# Patient Record
Sex: Male | Born: 1955 | Race: Black or African American | Hispanic: No | Marital: Single | Smoking: Current every day smoker
Health system: Southern US, Community
[De-identification: ages and names within clinical notes are randomized; demographics above are authoritative.]

## PROBLEM LIST (undated history)

## (undated) DIAGNOSIS — F22 Delusional disorders: Secondary | ICD-10-CM

## (undated) DIAGNOSIS — M1990 Unspecified osteoarthritis, unspecified site: Secondary | ICD-10-CM

## (undated) DIAGNOSIS — E119 Type 2 diabetes mellitus without complications: Secondary | ICD-10-CM

## (undated) DIAGNOSIS — J40 Bronchitis, not specified as acute or chronic: Secondary | ICD-10-CM

## (undated) DIAGNOSIS — H409 Unspecified glaucoma: Secondary | ICD-10-CM

## (undated) DIAGNOSIS — I469 Cardiac arrest, cause unspecified: Secondary | ICD-10-CM

## (undated) DIAGNOSIS — F259 Schizoaffective disorder, unspecified: Secondary | ICD-10-CM

## (undated) DIAGNOSIS — M069 Rheumatoid arthritis, unspecified: Secondary | ICD-10-CM

## (undated) DIAGNOSIS — F250 Schizoaffective disorder, bipolar type: Secondary | ICD-10-CM

## (undated) HISTORY — PX: OTHER SURGICAL HISTORY: SHX169

## (undated) HISTORY — PX: MANDIBLE FRACTURE SURGERY: SHX706

---

## 2014-05-28 ENCOUNTER — Emergency Department (HOSPITAL_COMMUNITY)
Admission: EM | Admit: 2014-05-28 | Discharge: 2014-05-28 | Disposition: A | Discharge status: Home / Self Care | Payer: Medicaid Other | Attending: Emergency Medicine | Admitting: Emergency Medicine

## 2014-05-28 ENCOUNTER — Encounter (HOSPITAL_COMMUNITY): Payer: Self-pay | Admitting: Emergency Medicine

## 2014-05-28 ENCOUNTER — Emergency Department (HOSPITAL_COMMUNITY): Payer: Medicaid Other

## 2014-05-28 DIAGNOSIS — H409 Unspecified glaucoma: Secondary | ICD-10-CM | POA: Insufficient documentation

## 2014-05-28 DIAGNOSIS — F17200 Nicotine dependence, unspecified, uncomplicated: Secondary | ICD-10-CM | POA: Insufficient documentation

## 2014-05-28 DIAGNOSIS — R079 Chest pain, unspecified: Secondary | ICD-10-CM | POA: Insufficient documentation

## 2014-05-28 DIAGNOSIS — Z7982 Long term (current) use of aspirin: Secondary | ICD-10-CM | POA: Insufficient documentation

## 2014-05-28 DIAGNOSIS — F22 Delusional disorders: Secondary | ICD-10-CM | POA: Insufficient documentation

## 2014-05-28 DIAGNOSIS — Z8709 Personal history of other diseases of the respiratory system: Secondary | ICD-10-CM | POA: Insufficient documentation

## 2014-05-28 HISTORY — DX: Unspecified glaucoma: H40.9

## 2014-05-28 HISTORY — DX: Bronchitis, not specified as acute or chronic: J40

## 2014-05-28 HISTORY — DX: Schizoaffective disorder, bipolar type: F25.0

## 2014-05-28 HISTORY — DX: Delusional disorders: F22

## 2014-05-28 HISTORY — DX: Schizoaffective disorder, unspecified: F25.9

## 2014-05-28 LAB — COMPREHENSIVE METABOLIC PANEL
ALT: 7 U/L (ref 0–53)
AST: 9 U/L (ref 0–37)
Albumin: 3.7 g/dL (ref 3.5–5.2)
Alkaline Phosphatase: 80 U/L (ref 39–117)
BUN: 9 mg/dL (ref 6–23)
CO2: 27 meq/L (ref 19–32)
Calcium: 9.6 mg/dL (ref 8.4–10.5)
Chloride: 102 mEq/L (ref 96–112)
Creatinine, Ser: 0.84 mg/dL (ref 0.50–1.35)
GFR calc Af Amer: >90 mL/min (ref >90)
GLUCOSE: 78 mg/dL (ref 70–99)
Potassium: 4.5 mEq/L (ref 3.7–5.3)
SODIUM: 141 meq/L (ref 137–147)
TOTAL PROTEIN: 7.2 g/dL (ref 6.0–8.3)
Total Bilirubin: 0.2 mg/dL — ABNORMAL LOW (ref 0.3–1.2)

## 2014-05-28 LAB — CBC
HCT: 40.2 % (ref 39.0–52.0)
HEMOGLOBIN: 13.9 g/dL (ref 13.0–17.0)
MCH: 33.6 pg (ref 26.0–34.0)
MCHC: 34.6 g/dL (ref 30.0–36.0)
MCV: 97.1 fL (ref 78.0–100.0)
Platelets: 254 10*3/uL (ref 150–400)
RBC: 4.14 MIL/uL — AB (ref 4.22–5.81)
RDW: 14.8 % (ref 11.5–15.5)
WBC: 6.5 10*3/uL (ref 4.0–10.5)

## 2014-05-28 LAB — TROPONIN I: Troponin I: <0.3 ng/mL (ref <0.30)

## 2014-05-28 LAB — MAGNESIUM: Magnesium: 2.2 mg/dL (ref 1.5–2.5)

## 2014-05-28 MED ORDER — ASPIRIN 81 MG PO CHEW: 324.0000 mg | CHEWABLE_TABLET | Freq: Once | ORAL | Status: DC

## 2014-05-28 MED ORDER — ASPIRIN 81 MG PO CHEW
243.0000 mg | CHEWABLE_TABLET | Freq: Once | ORAL | Status: AC
  Administered 2014-05-28: 243 mg via ORAL
  Filled 2014-05-28: qty 3

## 2014-05-28 NOTE — ED Notes (Signed)
Staff at [facility] states pt wanted to go to the post office. States he was told some one would take him but he would have to wait a while. States pt got mad because he could no go right then. States he got mad and told them he chest was hurting.

## 2014-05-28 NOTE — ED Notes (Signed)
Pt states he was arguing with staff at [facility] on Friday when he began having chest pain. States he got into another argument today with staff and his chest started hurting again.

## 2014-05-28 NOTE — ED Provider Notes (Signed)
CSN: [id number]     Arrival date & time 05/28/14  1220 History   First MD Initiated Contact with Patient 05/28/14 1303     Chief Complaint  Patient presents with  . Chest Pain     (Consider location/radiation/quality/duration/timing/severity/associated sxs/prior Treatment) HPI Patient presents with chest pain, now resolved. Patient notes that over the past days he is in some episodes of chest pain, including earlier today. Pain is typically left sided, pressure-like, lasts for several minutes. This episode began without clear precipitant, but became worse with a heated exchange with a caregiver. There was no concurrent dyspnea, lightheadedness, nausea, vomiting, abdominal pain. Previous episodes were seemingly similar. Patient has been evaluated once at another facility for chest pain in the last several months, had unremarkable evaluation, was discharged home. Patient states he is generally well aside from history of psychiatric disease, requiring residence in an assisted living facility. He currently denies any overt hallucinations, delusions. However the patient gives a rambling history, is clearly incapable of providing all aspects of relevant history of present illness. Level V caveat  Past Medical History  Diagnosis Date  . Bronchitis   . Schizo affective schizophrenia   . Paranoid   . Glaucoma    Past Surgical History  Procedure Laterality Date  . Mandible fracture surgery     No family history on file. History  Substance Use Topics  . Smoking status: Current Every Day Smoker    Types: Cigarettes  . Smokeless tobacco: Not on file  . Alcohol Use: No    Review of Systems  Unable to perform ROS: Psychiatric disorder      Allergies  Review of patient's allergies indicates no known allergies.  Home Medications   Prior to Admission medications   Medication Sig Start Date End Date Taking? Authorizing Provider  aspirin EC 81 MG tablet Take 81 mg by mouth daily.    Yes Historical Provider, MD  divalproex (DEPAKOTE) 250 MG DR tablet Take 500 mg by mouth at bedtime.   Yes Historical Provider, MD  haloperidol (HALDOL) 2 MG tablet Take 2 mg by mouth at bedtime.   Yes Historical Provider, MD  latanoprost (XALATAN) 0.005 % ophthalmic solution Place 1 drop into both eyes at bedtime.   Yes Historical Provider, MD  Travoprost, BAK Free, (TRAVATAN) 0.004 % SOLN ophthalmic solution Place 1 drop into both eyes at bedtime.   Yes Historical Provider, MD   BP 117/84  Pulse 68  Temp(Src) 98.2 F (36.8 C) (Oral)  Resp 18  SpO2 100% Physical Exam  Nursing note and vitals reviewed. Constitutional: He is oriented to person, place, and time. He appears well-developed. No distress.  HENT:  Head: Normocephalic and atraumatic.  Middle-aged male, no distress, poorly applied haircoloring to eye brows, hair  Eyes: Conjunctivae and EOM are normal.  Cardiovascular: Normal rate and regular rhythm.   Pulmonary/Chest: Effort normal. No stridor. No respiratory distress.  Abdominal: He exhibits no distension.  Musculoskeletal: He exhibits no edema.  Neurological: He is alert and oriented to person, place, and time.  Skin: Skin is warm and dry.  Psychiatric: He has a normal mood and affect. His speech is tangential. Thought content is delusional. Cognition and memory are impaired.    ED Course  Procedures (including critical care time) Labs Review Labs Reviewed  CBC - Abnormal; Notable for the following:    RBC 4.14 (*)    All other components within normal limits  COMPREHENSIVE METABOLIC PANEL - Abnormal; Notable for the following:  Total Bilirubin 0.2 (*)    All other components within normal limits  MAGNESIUM  TROPONIN I    Imaging Review Dg Chest 2 View  05/28/2014   CLINICAL DATA:  Left chest pressure today. Recent cough and congestion.  EXAM: CHEST  2 VIEW  COMPARISON:  None.  FINDINGS: The heart size and mediastinal contours are within normal limits. Both  lungs are clear. The visualized skeletal structures are unremarkable.  IMPRESSION: No active cardiopulmonary disease.   Electronically Signed   By: Amie Portland M.D.   On: 05/28/2014 13:57     EKG Interpretation   Date/Time:  Monday May 28 2014 12:34:01 EDT Ventricular Rate:  70 PR Interval:  127 QRS Duration: 79 QT Interval:  364 QTC Calculation: 393 R Axis:   25 Text Interpretation:  Sinus rhythm Borderline T abnormalities, inferior  leads Sinus rhythm T wave abnormality Artifact Abnormal ekg Confirmed by  Gerhard Munch  MD [phone]) on 05/28/2014 1:06:21 PM      I reviewed the patient's [employer] medical record, and after the initial evaluation, contacted social work to assist with his consideration of other living options.  3:28 PM Patient sitting on the edge of the bed, requesting discharge.  She has no new complaints, no chest pain.  MDM  Patient presents after an episode of chest pain, now resolved. Patient's evaluation here is largely reassuring, with no evidence of ongoing ischemia, systemic infection, pulmonary embolism. After the initial evaluation I discussed the patient's case with social work assisted with providing additional resources to the patient.  Gerhard Munch, MD 05/28/14 [phone]

## 2014-05-28 NOTE — Discharge Instructions (Signed)
As discussed, your evaluation today has been largely reassuring.  But, it is important that you monitor your condition carefully, and do not hesitate to return to the ED if you develop new, or concerning changes in your condition. ° °Otherwise, please follow-up with your physician for appropriate ongoing care. ° ° °Chest Pain (Nonspecific) °It is often hard to give a specific diagnosis for the cause of chest pain. There is always a chance that your pain could be related to something serious, such as a heart attack or a blood clot in the lungs. You need to follow up with your caregiver for further evaluation. °CAUSES  °· Heartburn. °· Pneumonia or bronchitis. °· Anxiety or stress. °· Inflammation around your heart (pericarditis) or lung (pleuritis or pleurisy). °· A blood clot in the lung. °· A collapsed lung (pneumothorax). It can develop suddenly on its own (spontaneous pneumothorax) or from injury (trauma) to the chest. °· Shingles infection (herpes zoster virus). °The chest wall is composed of bones, muscles, and cartilage. Any of these can be the source of the pain. °· The bones can be bruised by injury. °· The muscles or cartilage can be strained by coughing or overwork. °· The cartilage can be affected by inflammation and become sore (costochondritis). °DIAGNOSIS  °Lab tests or other studies, such as X-rays, electrocardiography, stress testing, or cardiac imaging, may be needed to find the cause of your pain.  °TREATMENT  °· Treatment depends on what may be causing your chest pain. Treatment may include: °· Acid blockers for heartburn. °· Anti-inflammatory medicine. °· Pain medicine for inflammatory conditions. °· Antibiotics if an infection is present. °· You may be advised to change lifestyle habits. This includes stopping smoking and avoiding alcohol, caffeine, and chocolate. °· You may be advised to keep your head raised (elevated) when sleeping. This reduces the chance of acid going backward from your  stomach into your esophagus. °· Most of the time, nonspecific chest pain will improve within 2 to 3 days with rest and mild pain medicine. °HOME CARE INSTRUCTIONS  °· If antibiotics were prescribed, take your antibiotics as directed. Finish them even if you start to feel better. °· For the next few days, avoid physical activities that bring on chest pain. Continue physical activities as directed. °· Do not smoke. °· Avoid drinking alcohol. °· Only take over-the-counter or prescription medicine for pain, discomfort, or fever as directed by your caregiver. °· Follow your caregiver's suggestions for further testing if your chest pain does not go away. °· Keep any follow-up appointments you made. If you do not go to an appointment, you could develop lasting (chronic) problems with pain. If there is any problem keeping an appointment, you must call to reschedule. °SEEK MEDICAL CARE IF:  °· You think you are having problems from the medicine you are taking. Read your medicine instructions carefully. °· Your chest pain does not go away, even after treatment. °· You develop a rash with blisters on your chest. °SEEK IMMEDIATE MEDICAL CARE IF:  °· You have increased chest pain or pain that spreads to your arm, neck, jaw, back, or abdomen. °· You develop shortness of breath, an increasing cough, or you are coughing up blood. °· You have severe back or abdominal pain, feel nauseous, or vomit. °· You develop severe weakness, fainting, or chills. °· You have a fever. °THIS IS AN EMERGENCY. Do not wait to see if the pain will go away. Get medical help at once. Call your local emergency services (  911 in [place] Do not drive yourself to the hospital. °MAKE SURE YOU:  °· Understand these instructions. °· Will watch your condition. °· Will get help right away if you are not doing well or get worse. °Document Released: 09/16/2005 Document Revised: 02/29/2012 Document Reviewed: 07/12/2008 °ExitCare® Patient Information ©2014 [employer]. ° ° °

## 2014-05-29 NOTE — Care Management Note (Signed)
Pt from ALF in [place], and was asking about moving to another facility. He had multiple complaints about the facility, such as "they would not take me to the the post office for 5 days in a row, to pick up a package that my sister sent me". Per RN EMS says all complaints were the same as thoses given to them about the last ALF pt was in. They just transferred him about 1 month ago to this new ALF. RN also reports facility employee said pt asked to be taken to post office today for the first time, and became angry when told he would have to wait for the Zenaida Niece and driver to return, as they were out on a trip with someone else, but would return soon. Was asked to give pt the list of facilities in [facility], per CSW, Derenda Fennel, since he wanted to get out of [place], and this was done. He expressed appreciation. Suggested he call his case worker at [facility] if he is having problems with his ALF. He says "she never returns my calls". Also suggested he call a family member he assist, and advocate for him. He says they all live in another county. He is thinking of maybe moving to that county to be near them. Suggested that this would be a very good idea, to be close to family, in order to have family to assist him as needed.

## 2014-06-20 ENCOUNTER — Emergency Department (HOSPITAL_COMMUNITY)
Admission: EM | Admit: 2014-06-20 | Discharge: 2014-06-21 | Disposition: A | Discharge status: Home / Self Care | Payer: Medicaid Other | Attending: Emergency Medicine | Admitting: Emergency Medicine

## 2014-06-20 ENCOUNTER — Emergency Department (HOSPITAL_COMMUNITY): Payer: Medicaid Other

## 2014-06-20 ENCOUNTER — Encounter (HOSPITAL_COMMUNITY): Payer: Self-pay | Admitting: Emergency Medicine

## 2014-06-20 DIAGNOSIS — F259 Schizoaffective disorder, unspecified: Secondary | ICD-10-CM | POA: Insufficient documentation

## 2014-06-20 DIAGNOSIS — Z79899 Other long term (current) drug therapy: Secondary | ICD-10-CM | POA: Insufficient documentation

## 2014-06-20 DIAGNOSIS — R079 Chest pain, unspecified: Secondary | ICD-10-CM | POA: Insufficient documentation

## 2014-06-20 DIAGNOSIS — H409 Unspecified glaucoma: Secondary | ICD-10-CM | POA: Insufficient documentation

## 2014-06-20 DIAGNOSIS — Z8709 Personal history of other diseases of the respiratory system: Secondary | ICD-10-CM | POA: Diagnosis not present

## 2014-06-20 DIAGNOSIS — Z733 Stress, not elsewhere classified: Secondary | ICD-10-CM | POA: Insufficient documentation

## 2014-06-20 DIAGNOSIS — Z7982 Long term (current) use of aspirin: Secondary | ICD-10-CM | POA: Diagnosis not present

## 2014-06-20 DIAGNOSIS — F439 Reaction to severe stress, unspecified: Secondary | ICD-10-CM

## 2014-06-20 DIAGNOSIS — F17200 Nicotine dependence, unspecified, uncomplicated: Secondary | ICD-10-CM | POA: Insufficient documentation

## 2014-06-20 LAB — CBC WITH DIFFERENTIAL/PLATELET
BASOS ABS: 0 10*3/uL (ref 0.0–0.1)
Basophils Relative: 0 % (ref 0–1)
Eosinophils Absolute: 0.2 10*3/uL (ref 0.0–0.7)
Eosinophils Relative: 3 % (ref 0–5)
HCT: 40.8 % (ref 39.0–52.0)
Hemoglobin: 14 g/dL (ref 13.0–17.0)
Lymphocytes Relative: 44 % (ref 12–46)
Lymphs Abs: 3.5 10*3/uL (ref 0.7–4.0)
MCH: 33.3 pg (ref 26.0–34.0)
MCHC: 34.3 g/dL (ref 30.0–36.0)
MCV: 97.1 fL (ref 78.0–100.0)
Monocytes Absolute: 0.7 10*3/uL (ref 0.1–1.0)
Monocytes Relative: 9 % (ref 3–12)
NEUTROS PCT: 44 % (ref 43–77)
Neutro Abs: 3.5 10*3/uL (ref 1.7–7.7)
PLATELETS: 306 10*3/uL (ref 150–400)
RBC: 4.2 MIL/uL — AB (ref 4.22–5.81)
RDW: 15.7 % — AB (ref 11.5–15.5)
WBC: 7.8 10*3/uL (ref 4.0–10.5)

## 2014-06-20 LAB — URINALYSIS, ROUTINE W REFLEX MICROSCOPIC
Bilirubin Urine: NEGATIVE
Glucose, UA: NEGATIVE mg/dL
Hgb urine dipstick: NEGATIVE
KETONES UR: NEGATIVE mg/dL
NITRITE: NEGATIVE
Protein, ur: NEGATIVE mg/dL
Urobilinogen, UA: 0.2 mg/dL (ref 0.0–1.0)
pH: 5.5 (ref 5.0–8.0)

## 2014-06-20 LAB — URINE MICROSCOPIC-ADD ON

## 2014-06-20 MED ORDER — ASPIRIN 81 MG PO CHEW
324.0000 mg | CHEWABLE_TABLET | Freq: Once | ORAL | Status: DC
  Filled 2014-06-20: qty 4

## 2014-06-20 NOTE — ED Notes (Signed)
Pt c/o chest pain after arguing with facility workers. Pt states [facility] lives [facility].

## 2014-06-20 NOTE — ED Notes (Signed)
Pt states he needs to go to a psych ward for help. Pt states he is being tormented. Pt states he is in a crisis. Pt will not admit and si/hi ideations at this time.

## 2014-06-21 LAB — BASIC METABOLIC PANEL
ANION GAP: 11 (ref 5–15)
BUN: 8 mg/dL (ref 6–23)
CALCIUM: 9.5 mg/dL (ref 8.4–10.5)
CHLORIDE: 104 meq/L (ref 96–112)
CO2: 26 meq/L (ref 19–32)
Creatinine, Ser: 0.84 mg/dL (ref 0.50–1.35)
GFR calc Af Amer: >90 mL/min (ref >90)
GFR calc non Af Amer: >90 mL/min (ref >90)
GLUCOSE: 78 mg/dL (ref 70–99)
Potassium: 3.9 mEq/L (ref 3.7–5.3)
SODIUM: 141 meq/L (ref 137–147)

## 2014-06-21 LAB — TROPONIN I

## 2014-06-21 NOTE — ED Provider Notes (Signed)
Medical screening examination/treatment/procedure(s) were conducted as a shared visit with non-physician practitioner(s) and myself.  I personally evaluated the patient during the encounter.   EKG Interpretation   Date/Time:  Wednesday June 20 2014 23:44:50 EDT Ventricular Rate:  64 PR Interval:  134 QRS Duration: 77 QT Interval:  371 QTC Calculation: 383 R Axis:   -12 Text Interpretation:  Sinus rhythm Inferior infarct, old No significant  change was found Confirmed by Layken Beg  MD, Kaithlyn Teagle ([id number]) on 06/21/2014  1:09:21 AM      Overall well appearing. Low suspicion for ACS. Suspect GERD given hx. Recommended OTC prilosec. Does not have a pcp, recommended obtaining an appointment with one. Understands to return to ER for new or worsening symptoms  Lyanne CoKevin M Matsuko Kretz, MD 06/21/14 [phone]

## 2014-06-21 NOTE — ED Provider Notes (Signed)
CSN: [id number]     Arrival date & time 06/20/14  2230 History   First MD Initiated Contact with Patient 06/20/14 2248     Chief Complaint  Patient presents with  . Chest Pain     (Consider location/radiation/quality/duration/timing/severity/associated sxs/prior Treatment) The history is provided by the patient.    Frank Dean is a 58 y.o. male with a history of bronchitis and schizophrenia with paranoia, presenting with chest pain about 5 hours before arrival while arguing with a caregiver at his residence (lives in an assisted living setting). His pain was constant but improved since receiving ASA 324 mg per ems prior to arrival. Pertinent negatives include nausea, diaphoresis, shortness of breath, dizziness, headache and weakness.   He denies history of cardiac disease.  He also inquires about being admitted for a psych admission, but denies suicidal, homicidal ideation, nor is he having auditory or visual hallucinations.  He simply is not desirous of returning to his home tonight.  He has been in his assisted living for the past month but does not feel comfortable there and wants to find new housing. He does not feel unsafe in his home environment.       Past Medical History  Diagnosis Date  . Bronchitis   . Schizo affective schizophrenia   . Paranoid   . Glaucoma    Past Surgical History  Procedure Laterality Date  . Mandible fracture surgery     No family history on file. History  Substance Use Topics  . Smoking status: Current Every Day Smoker    Types: Cigarettes  . Smokeless tobacco: Not on file  . Alcohol Use: No    Review of Systems  Constitutional: Negative for fever.  HENT: Negative for congestion and sore throat.   Eyes: Negative.   Respiratory: Positive for chest tightness. Negative for shortness of breath and wheezing.   Cardiovascular: Negative for chest pain and palpitations.  Gastrointestinal: Negative for nausea and abdominal pain.  Genitourinary:  Negative.   Musculoskeletal: Negative for arthralgias, joint swelling and neck pain.  Skin: Negative.  Negative for rash and wound.  Neurological: Negative for dizziness, weakness, light-headedness, numbness and headaches.  Psychiatric/Behavioral: Negative.       Allergies  Review of patient's allergies indicates no known allergies.  Home Medications   Prior to Admission medications   Medication Sig Start Date End Date Taking? Authorizing Provider  aspirin EC 81 MG tablet Take 81 mg by mouth daily.   Yes Historical Provider, MD  divalproex (DEPAKOTE) 250 MG DR tablet Take 500 mg by mouth at bedtime.   Yes Historical Provider, MD  haloperidol (HALDOL) 2 MG tablet Take 2 mg by mouth at bedtime.   Yes Historical Provider, MD  latanoprost (XALATAN) 0.005 % ophthalmic solution Place 1 drop into both eyes at bedtime.   Yes Historical Provider, MD  Travoprost, BAK Free, (TRAVATAN) 0.004 % SOLN ophthalmic solution Place 1 drop into both eyes at bedtime.   Yes Historical Provider, MD   BP 117/77  Pulse 69  Temp(Src) 98 F (36.7 C) (Oral)  Resp 18  Ht 5\' 6"  (1.676 m)  Wt 160 lb (72.576 kg)  BMI 25.84 kg/m2  SpO2 97% Physical Exam  Nursing note and vitals reviewed. Constitutional: He appears well-developed and well-nourished.  HENT:  Head: Normocephalic and atraumatic.  Eyes: Conjunctivae are normal.  Neck: Normal range of motion.  Cardiovascular: Normal rate, regular rhythm, normal heart sounds and intact distal pulses.   Pulmonary/Chest: Effort normal and breath  sounds normal. He has no wheezes. He exhibits no tenderness.  Abdominal: Soft. Bowel sounds are normal. There is no tenderness.  Musculoskeletal: Normal range of motion.  Neurological: He is alert.  Skin: Skin is warm and dry.  Psychiatric: He has a normal mood and affect.    ED Course  Procedures (including critical care time) Labs Review Labs Reviewed  URINALYSIS, ROUTINE W REFLEX MICROSCOPIC - Abnormal; Notable  for the following:    Color, Urine STRAW (*)    Specific Gravity, Urine <1.005 (*)    Leukocytes, UA MODERATE (*)    All other components within normal limits  CBC WITH DIFFERENTIAL - Abnormal; Notable for the following:    RBC 4.20 (*)    RDW 15.7 (*)    All other components within normal limits  TROPONIN I  BASIC METABOLIC PANEL  URINE MICROSCOPIC-ADD ON    Imaging Review Dg Chest 2 View  06/21/2014   CLINICAL DATA:  CHEST PAIN  EXAM: CHEST  2 VIEW  COMPARISON:  Prior radiograph from 05/28/2014  FINDINGS: The cardiac and mediastinal silhouettes are stable in size and contour, and remain within normal limits.  The lungs are normally inflated. No airspace consolidation, pleural effusion, or pulmonary edema is identified. There is no pneumothorax.  No acute osseous abnormality identified.  IMPRESSION: No active cardiopulmonary disease.   Electronically Signed   By: Rise MuBenjamin  McClintock M.D.   On: 06/21/2014 01:13     EKG Interpretation   Date/Time:  Wednesday June 20 2014 23:44:50 EDT Ventricular Rate:  64 PR Interval:  134 QRS Duration: 77 QT Interval:  371 QTC Calculation: 383 R Axis:   -12 Text Interpretation:  Sinus rhythm Inferior infarct, old No significant  change was found Confirmed by CAMPOS  MD, KEVIN ([id number]) on 06/21/2014  1:09:21 AM      MDM   Final diagnoses:  Chest pain due to psychological stress    Pt with chest pain, now resolved.  This is his second visit here since last month for similar psych/stress induced chest pain.  His ekg and 6 hour troponin are stable/negative.  He was encouraged f/u with [facility] here in [place] for cardiac stress testing.  He should call for appt to arrange.  Instructions given for this.   The patient appears reasonably screened and/or stabilized for discharge and I doubt any other medical condition or other [facility] requiring further screening, evaluation, or treatment in the ED at this time prior to discharge.   Pt was  seen by Dr. Patria Maneampos prior to dc home.   Patients labs and/or radiological studies were viewed and considered during the medical decision making and disposition process.     Burgess AmorJulie Tenoch Mcclure, PA-C 06/21/14 [phone]

## 2014-06-21 NOTE — Discharge Instructions (Signed)

## 2014-07-02 ENCOUNTER — Emergency Department (HOSPITAL_COMMUNITY): Payer: Medicaid Other

## 2014-07-02 ENCOUNTER — Encounter (HOSPITAL_COMMUNITY): Payer: Self-pay | Admitting: Emergency Medicine

## 2014-07-02 ENCOUNTER — Emergency Department (HOSPITAL_COMMUNITY)
Admission: EM | Admit: 2014-07-02 | Discharge: 2014-07-02 | Disposition: A | Discharge status: Home / Self Care | Payer: Medicaid Other | Attending: Emergency Medicine | Admitting: Emergency Medicine

## 2014-07-02 DIAGNOSIS — F17200 Nicotine dependence, unspecified, uncomplicated: Secondary | ICD-10-CM | POA: Insufficient documentation

## 2014-07-02 DIAGNOSIS — Z7982 Long term (current) use of aspirin: Secondary | ICD-10-CM | POA: Insufficient documentation

## 2014-07-02 DIAGNOSIS — Z8669 Personal history of other diseases of the nervous system and sense organs: Secondary | ICD-10-CM | POA: Diagnosis not present

## 2014-07-02 DIAGNOSIS — Z8709 Personal history of other diseases of the respiratory system: Secondary | ICD-10-CM | POA: Diagnosis not present

## 2014-07-02 DIAGNOSIS — Z79899 Other long term (current) drug therapy: Secondary | ICD-10-CM | POA: Insufficient documentation

## 2014-07-02 DIAGNOSIS — N453 Epididymo-orchitis: Secondary | ICD-10-CM | POA: Diagnosis not present

## 2014-07-02 DIAGNOSIS — H409 Unspecified glaucoma: Secondary | ICD-10-CM | POA: Insufficient documentation

## 2014-07-02 DIAGNOSIS — R109 Unspecified abdominal pain: Secondary | ICD-10-CM | POA: Diagnosis present

## 2014-07-02 DIAGNOSIS — N451 Epididymitis: Secondary | ICD-10-CM

## 2014-07-02 LAB — URINALYSIS, ROUTINE W REFLEX MICROSCOPIC
BILIRUBIN URINE: NEGATIVE
Glucose, UA: NEGATIVE mg/dL
KETONES UR: NEGATIVE mg/dL
Nitrite: POSITIVE — AB
PROTEIN: NEGATIVE mg/dL
Specific Gravity, Urine: 1.015 (ref 1.005–1.030)
Urobilinogen, UA: 0.2 mg/dL (ref 0.0–1.0)
pH: 6.5 (ref 5.0–8.0)

## 2014-07-02 LAB — URINE MICROSCOPIC-ADD ON

## 2014-07-02 MED ORDER — FENTANYL CITRATE 0.05 MG/ML IJ SOLN
100.0000 ug | Freq: Once | INTRAMUSCULAR | Status: AC
  Administered 2014-07-02: 100 ug via NASAL
  Filled 2014-07-02: qty 2

## 2014-07-02 MED ORDER — LEVOFLOXACIN 750 MG PO TABS
750.0000 mg | ORAL_TABLET | Freq: Every day | ORAL | Status: DC
Start: 2014-07-02 — End: 2018-06-14

## 2014-07-02 MED ORDER — OXYCODONE-ACETAMINOPHEN 5-325 MG PO TABS: 2.0000 | ORAL_TABLET | Freq: Four times a day (QID) | ORAL | Status: DC | PRN

## 2014-07-02 MED ORDER — LEVOFLOXACIN 750 MG PO TABS
750.0000 mg | ORAL_TABLET | Freq: Once | ORAL | Status: AC
  Administered 2014-07-02: 750 mg via ORAL
  Filled 2014-07-02: qty 1

## 2014-07-02 NOTE — ED Notes (Signed)
[facility] called to pick patient up.

## 2014-07-02 NOTE — Discharge Instructions (Signed)
Epididymitis °Epididymitis is a swelling (inflammation) of the epididymis. The epididymis is a cord-like structure along the back part of the testicle. Epididymitis is usually, but not always, caused by infection. This is usually a sudden problem beginning with chills, fever and pain behind the scrotum and in the testicle. There may be swelling and redness of the testicle. °DIAGNOSIS  °Physical examination will reveal a tender, swollen epididymis. Sometimes, cultures are obtained from the urine or from prostate secretions to help find out if there is an infection or if the cause is a different problem. Sometimes, blood work is performed to see if your white blood cell count is elevated and if a germ (bacterial) or viral infection is present. Using this knowledge, an appropriate medicine which kills germs (antibiotic) can be chosen by your caregiver. A viral infection causing epididymitis will most often go away (resolve) without treatment. °HOME CARE INSTRUCTIONS  °· Hot sitz baths for 20 minutes, 4 times per day, may help relieve pain. °· Only take over-the-counter or prescription medicines for pain, discomfort or fever as directed by your caregiver. °· Take all medicines, including antibiotics, as directed. Take the antibiotics for the full prescribed length of time even if you are feeling better. °· It is very important to keep all follow-up appointments. °SEEK IMMEDIATE MEDICAL CARE IF:  °· You have a fever. °· You have pain not relieved with medicines. °· You have any worsening of your problems. °· Your pain seems to come and go. °· You develop pain, redness, and swelling in the scrotum and surrounding areas. °MAKE SURE YOU:  °· Understand these instructions. °· Will watch your condition. °· Will get help right away if you are not doing well or get worse. °Document Released: 12/04/2000 Document Revised: 02/29/2012 Document Reviewed: 10/24/2009 °ExitCare® Patient Information ©2015 [employer]. This information  is not intended to replace advice given to you by your health care provider. Make sure you discuss any questions you have with your health care provider. ° °

## 2014-07-02 NOTE — ED Provider Notes (Signed)
CSN: [id number]     Arrival date & time 07/02/14  1620 History   First MD Initiated Contact with Patient 07/02/14 1923     Chief Complaint  Patient presents with  . Groin Pain     (Consider location/radiation/quality/duration/timing/severity/associated sxs/prior Treatment) HPI 58 year old male with 2 weeks of constant 24/7 mild left testicular pain worsening the last 2 days and worse again all day today worse with palpation and movement with no trauma no dysuria no abdominal pain no vomiting with pain radiating at times toward his back and left thigh with no rash on his skin no fever no weakness or numbness to his legs and no change in bowel or bladder function. Past Medical History  Diagnosis Date  . Bronchitis   . Schizo affective schizophrenia   . Paranoid   . Glaucoma    Past Surgical History  Procedure Laterality Date  . Mandible fracture surgery     No family history on file. History  Substance Use Topics  . Smoking status: Current Every Day Smoker    Types: Cigarettes  . Smokeless tobacco: Not on file  . Alcohol Use: No    Review of Systems 10 Systems reviewed and are negative for acute change except as noted in the HPI.   Allergies  Review of patient's allergies indicates no known allergies.  Home Medications   Prior to Admission medications   Medication Sig Start Date End Date Taking? Authorizing Provider  aspirin EC 81 MG tablet Take 81 mg by mouth daily.   Yes Historical Provider, MD  divalproex (DEPAKOTE) 250 MG DR tablet Take 500 mg by mouth at bedtime.   Yes Historical Provider, MD  haloperidol (HALDOL) 2 MG tablet Take 2 mg by mouth at bedtime.   Yes Historical Provider, MD  latanoprost (XALATAN) 0.005 % ophthalmic solution Place 1 drop into both eyes at bedtime.   Yes Historical Provider, MD  Travoprost, BAK Free, (TRAVATAN) 0.004 % SOLN ophthalmic solution Place 1 drop into both eyes at bedtime.   Yes Historical Provider, MD  levofloxacin (LEVAQUIN)  750 MG tablet Take 1 tablet (750 mg total) by mouth daily. X 10 days 07/02/14   Hurman HornJohn M Steen Bisig, MD  oxyCODONE-acetaminophen (PERCOCET) 5-325 MG per tablet Take 2 tablets by mouth every 6 (six) hours as needed for severe pain. Dispense to go. 07/02/14   Hurman HornJohn M Harvey Lingo, MD  oxyCODONE-acetaminophen (PERCOCET) 5-325 MG per tablet Take 2 tablets by mouth every 6 (six) hours as needed for severe pain. 07/02/14   Hurman HornJohn M Lety Cullens, MD   BP 122/73  Pulse 64  Temp(Src) 98.2 F (36.8 C) (Oral)  Resp 18  SpO2 100% Physical Exam  Nursing note and vitals reviewed. Constitutional:  Awake, alert, nontoxic appearance.  HENT:  Head: Atraumatic.  Eyes: Right eye exhibits no discharge. Left eye exhibits no discharge.  Neck: Neck supple.  Cardiovascular: Normal rate and regular rhythm.   No murmur heard. Pulmonary/Chest: Effort normal and breath sounds normal. No respiratory distress. He has no wheezes. He has no rales. He exhibits no tenderness.  Abdominal: Soft. Bowel sounds are normal. He exhibits no distension and no mass. There is no tenderness. There is no rebound and no guarding.  Genitourinary:  Right testicle and right inguinal canal nontender; left testicular region appears tender and enlarged with no definite palpable inguinal hernia although limited exam due to the patient's discomfort with scrotal sac having normal appearance to the skin  Musculoskeletal: He exhibits no tenderness.  Baseline ROM, no  obvious new focal weakness.  Neurological: He is alert.  Mental status and motor strength appears baseline for patient and situation.  Skin: No rash noted.  Psychiatric: He has a normal mood and affect.    ED Course  Procedures (including critical care time) Patient / Family / Caregiver informed of clinical course, understand medical decision-making process, and agree with plan. Labs Review Labs Reviewed  URINALYSIS, ROUTINE W REFLEX MICROSCOPIC - Abnormal; Notable for the following:    APPearance  HAZY (*)    Hgb urine dipstick TRACE (*)    Nitrite POSITIVE (*)    Leukocytes, UA LARGE (*)    All other components within normal limits  URINE MICROSCOPIC-ADD ON - Abnormal; Notable for the following:    Bacteria, UA MANY (*)    All other components within normal limits  URINE CULTURE    Imaging Review No results found.   EKG Interpretation None      MDM   Final diagnoses:  Epididymitis, left    I doubt any other EMC precluding discharge at this time including, but not necessarily limited to the following:testicular torsion.    Hurman Horn, MD 07/06/14 [phone]

## 2014-07-02 NOTE — ED Notes (Signed)
C./o left groin pain radiating down left leg x1 weeks. Hx of abscess to area per pt.

## 2014-07-05 LAB — URINE CULTURE: Colony Count: >=100000

## 2014-07-06 ENCOUNTER — Telehealth (HOSPITAL_BASED_OUTPATIENT_CLINIC_OR_DEPARTMENT_OTHER): Payer: Self-pay | Admitting: Emergency Medicine

## 2014-07-06 NOTE — Telephone Encounter (Signed)
Post ED Visit - Positive Culture Follow-up  Culture report reviewed by antimicrobial stewardship pharmacist: []  Wes Dulaney, Pharm.D., BCPS []  Celedonio MiyamotoJeremy Frens, Pharm.D., BCPS [x]  Georgina PillionElizabeth Martin, Pharm.D., BCPS []  WildomarMinh Pham, [address] Rainbow BoulevardPharm.D., BCPS, AAHIVP []  Estella HuskMichelle Turner, Pharm.D., BCPS, AAHIVP []    Positive urine culture Treated with Levofloxacin, organism sensitive to the same and no further patient follow-up is required at this time.  Norm ParcelGammons, Brigham Cobbins [facility] Center[facility]aney 07/06/2014, 10:06 AM

## 2014-07-09 MED FILL — Oxycodone w/ Acetaminophen Tab 5-325 MG: ORAL | Qty: 6 | Status: AC

## 2015-02-24 IMAGING — US US ART/VEN ABD/PELV/SCROTUM DOPPLER LTD
1 series · 13 of 25 positions shown · non-contrast
Comparison: None.

CLINICAL DATA: Left testicular pain.

EXAM:
SCROTAL ULTRASOUND
DOPPLER ULTRASOUND OF THE TESTICLES
TECHNIQUE: Complete ultrasound examination of the testicles, epididymis, and
other scrotal structures was performed. Color and spectral Doppler
ultrasound were also utilized to evaluate blood flow to the
testicles.

[Series 1: us art/ven abd/pelv/scrotum doppler ltd · 0.04mm/px · 13 of 86 slices shown]
[im 1/86]
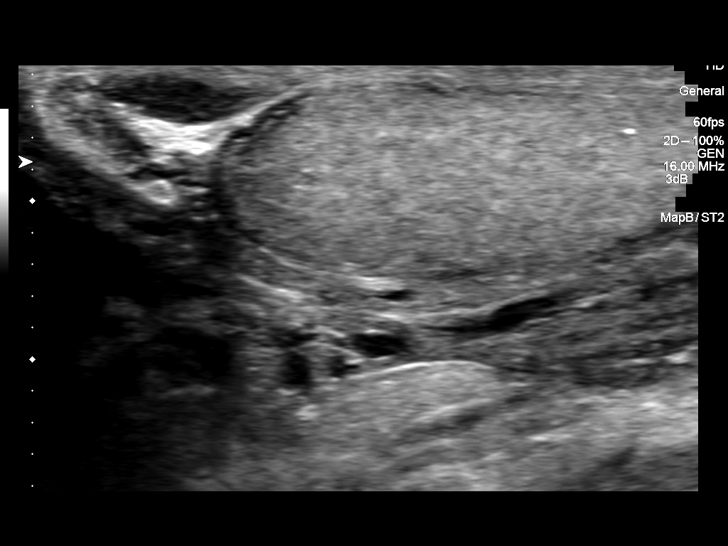
[im 8/86]
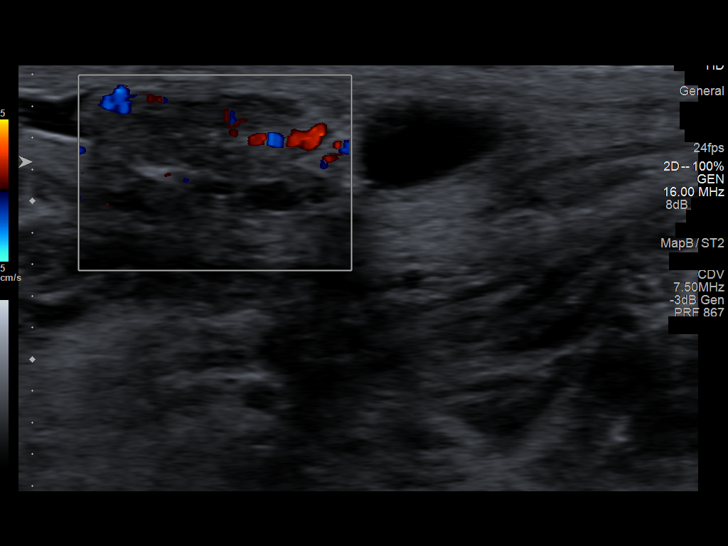
[im 15/86]
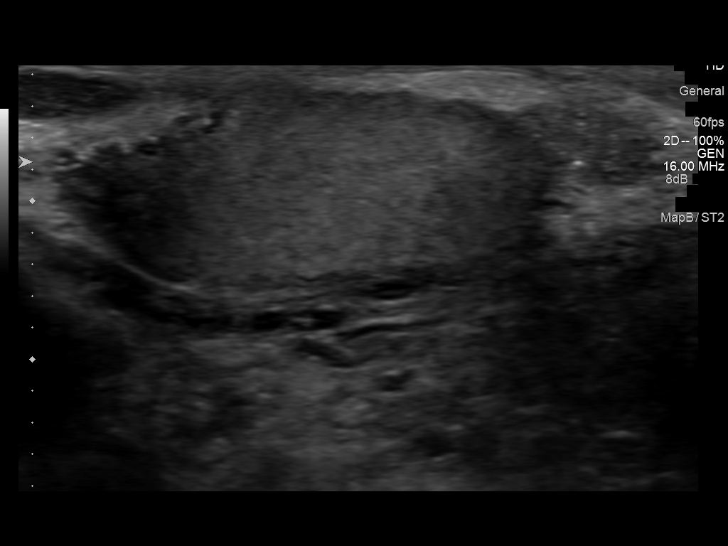
[im 22/86]
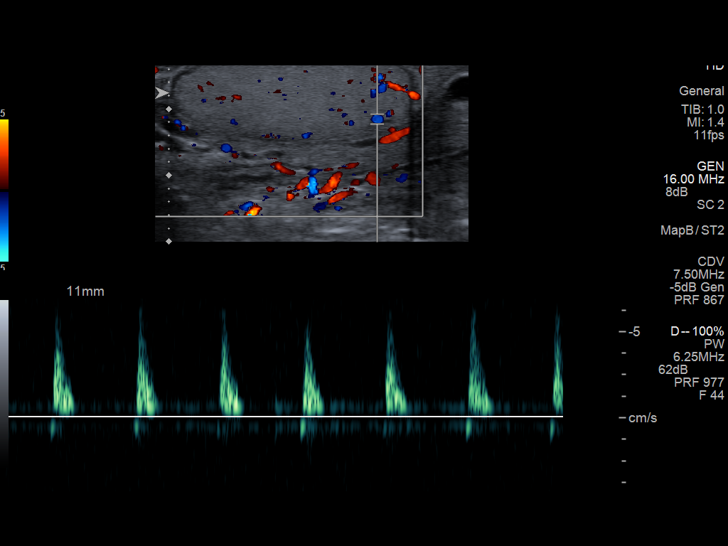
[im 29/86]
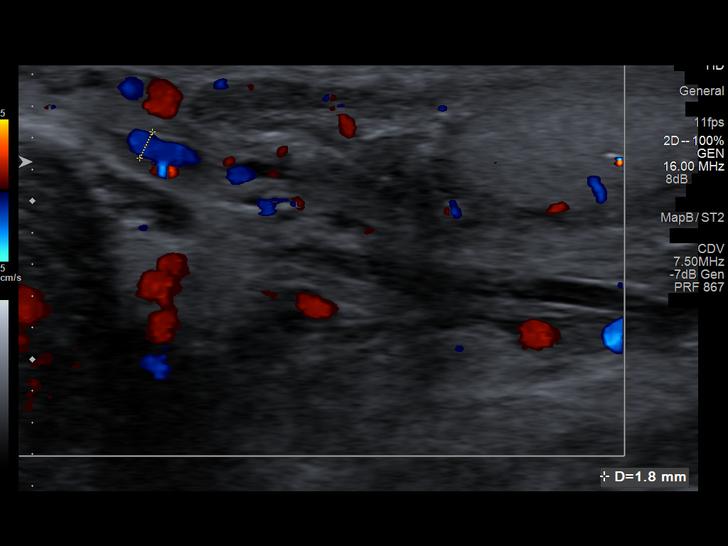
[im 36/86]
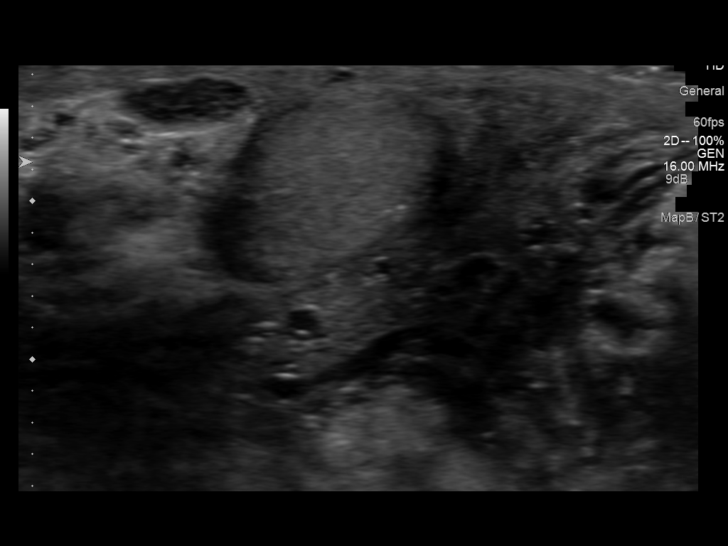
[im 43/86]
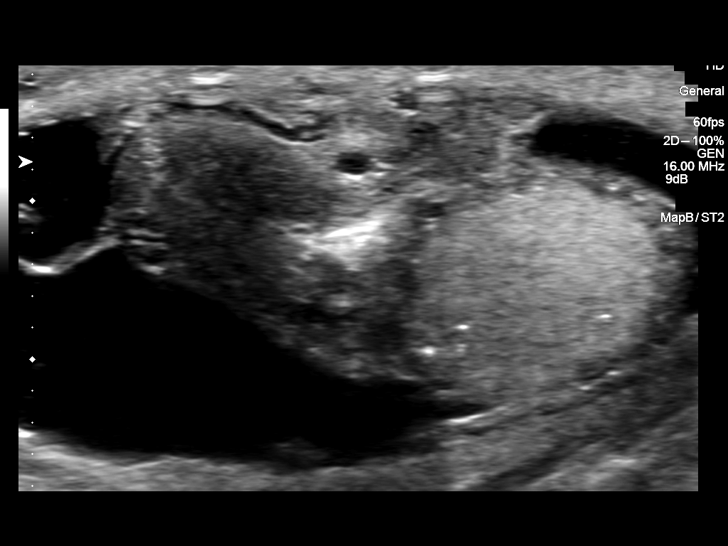
[im 50/86]
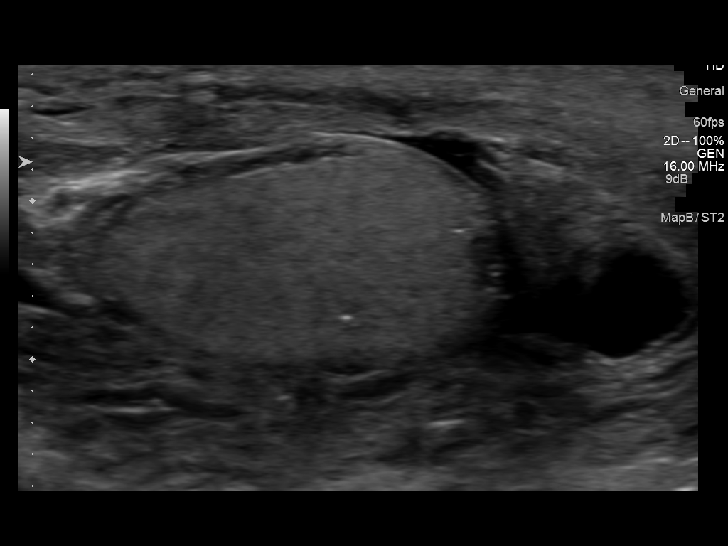
[im 57/86]
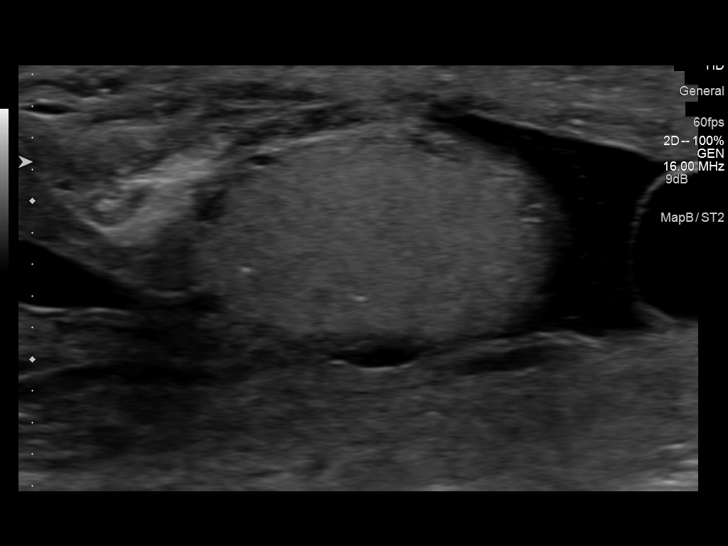
[im 64/86]
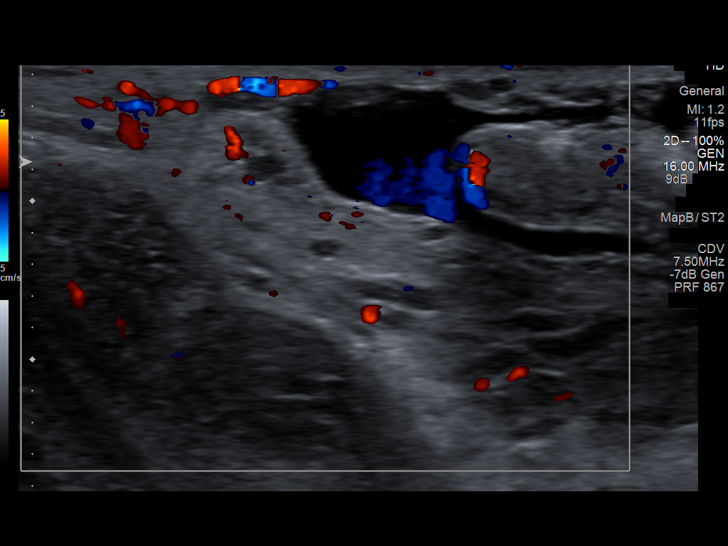
[im 71/86]
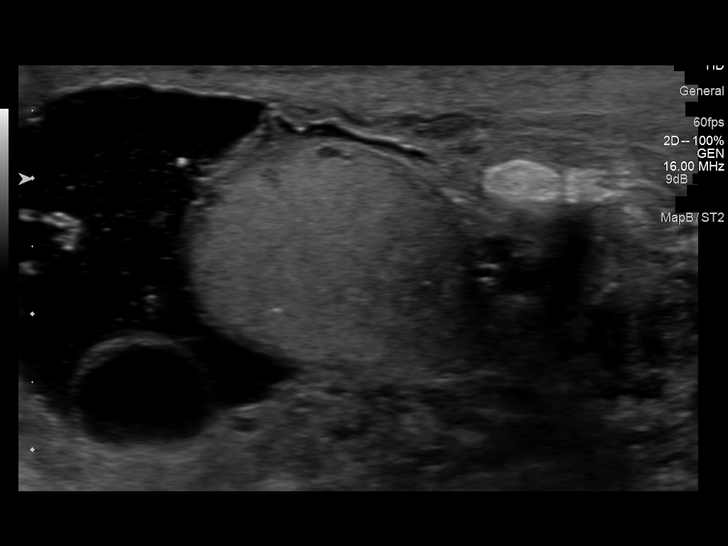
[im 78/86]
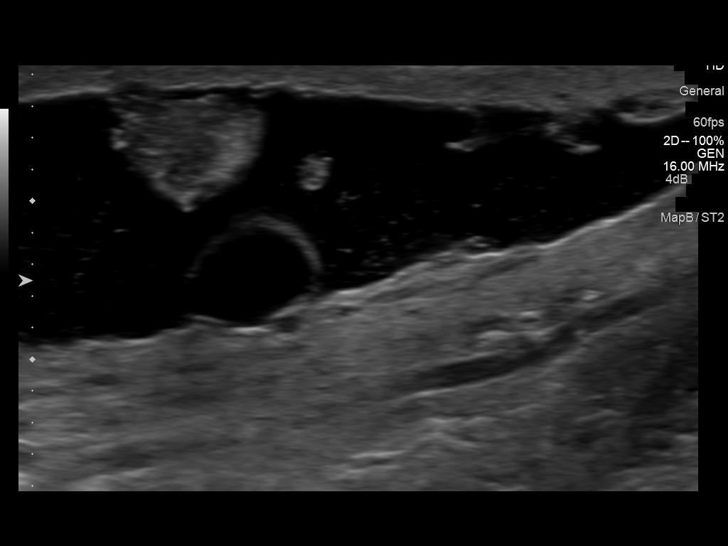
[im 86/86]
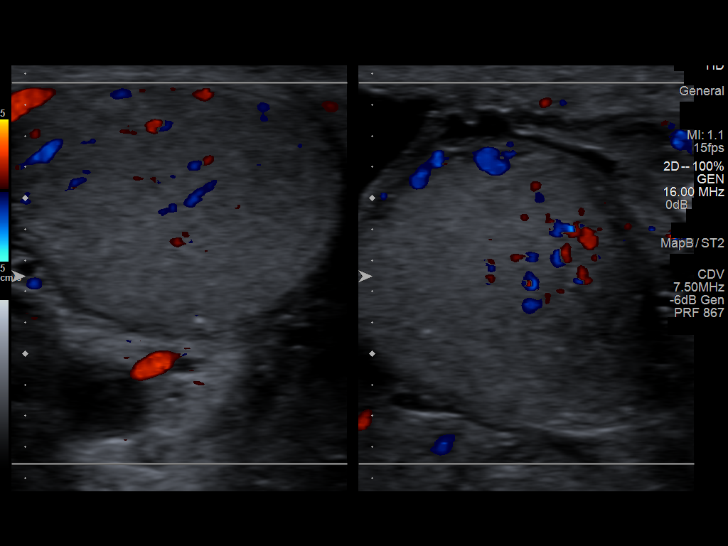

[13 of 25 positions shown; findings below may reference images not displayed]

FINDINGS: Right testicle

Measurements: 3.5 x 1.9 x 1.2 cm. Microlithiasis is noted. No mass
is noted.

Left testicle

Measurements: 2.6 x 2.1 x 1.5 cm. No mass is noted. Microlithiasis
is noted.

Right epididymis: 8 mm septated cyst is noted. Otherwise no
abnormality seen.

Left epididymis: 2.3 mm cyst is noted. Left epididymis is slightly
enlarged suggesting possible epididymitis.

Hydrocele:  Septated left hydrocele is noted.

Varicocele:  Mild bilateral varicoceles are noted.

Pulsed Doppler interrogation of both testes demonstrates low
resistance arterial and venous waveforms bilaterally.
IMPRESSION: No evidence of testicular mass or torsion.

Possible mild left epididymitis. Bilateral epididymal cysts are
noted.

Moderate septated left hydrocele is noted.

Mild bilateral varicoceles are noted.

Bilateral testicular microlithiasis is noted. Current literature
suggests that testicular microlithiasis is not a significant
independent risk factor for development of testicular carcinoma, and
that follow up imaging is not warranted in the absence of other risk
factors. Monthly testicular self-examination and annual physical
exams are considered appropriate surveillance. If patient has other
risk factors for testicular carcinoma, then referral to Urology
should be considered. (Reference: Andra, et al.: A 5-Year Follow
up Study of Asymptomatic Men with Testicular Microlithiasis. J Urol
7443; [id number].)

## 2018-06-14 ENCOUNTER — Emergency Department: Payer: Medicaid Other

## 2018-06-14 ENCOUNTER — Other Ambulatory Visit: Payer: Self-pay

## 2018-06-14 ENCOUNTER — Encounter: Payer: Self-pay | Admitting: Emergency Medicine

## 2018-06-14 ENCOUNTER — Observation Stay
Admission: EM | Admit: 2018-06-14 | Discharge: 2018-06-14 | Discharge status: Against Medical Advice | Payer: Medicaid Other | Attending: Internal Medicine | Admitting: Internal Medicine

## 2018-06-14 DIAGNOSIS — I252 Old myocardial infarction: Secondary | ICD-10-CM | POA: Insufficient documentation

## 2018-06-14 DIAGNOSIS — R072 Precordial pain: Principal | ICD-10-CM | POA: Insufficient documentation

## 2018-06-14 DIAGNOSIS — F17210 Nicotine dependence, cigarettes, uncomplicated: Secondary | ICD-10-CM | POA: Diagnosis not present

## 2018-06-14 DIAGNOSIS — M79604 Pain in right leg: Secondary | ICD-10-CM | POA: Diagnosis not present

## 2018-06-14 DIAGNOSIS — H409 Unspecified glaucoma: Secondary | ICD-10-CM | POA: Insufficient documentation

## 2018-06-14 DIAGNOSIS — Z79899 Other long term (current) drug therapy: Secondary | ICD-10-CM | POA: Diagnosis not present

## 2018-06-14 DIAGNOSIS — R9431 Abnormal electrocardiogram [ECG] [EKG]: Secondary | ICD-10-CM | POA: Insufficient documentation

## 2018-06-14 DIAGNOSIS — Z7984 Long term (current) use of oral hypoglycemic drugs: Secondary | ICD-10-CM | POA: Diagnosis not present

## 2018-06-14 DIAGNOSIS — E118 Type 2 diabetes mellitus with unspecified complications: Secondary | ICD-10-CM | POA: Insufficient documentation

## 2018-06-14 DIAGNOSIS — R079 Chest pain, unspecified: Secondary | ICD-10-CM | POA: Diagnosis present

## 2018-06-14 LAB — BASIC METABOLIC PANEL
Anion gap: 9 (ref 5–15)
BUN: 10 mg/dL (ref 8–23)
CHLORIDE: 105 mmol/L (ref 98–111)
CO2: 24 mmol/L (ref 22–32)
CREATININE: 0.97 mg/dL (ref 0.61–1.24)
Calcium: 9.2 mg/dL (ref 8.9–10.3)
GFR calc Af Amer: >60 mL/min (ref >60)
GFR calc non Af Amer: >60 mL/min (ref >60)
Glucose, Bld: 128 mg/dL — ABNORMAL HIGH (ref 70–99)
Potassium: 3.9 mmol/L (ref 3.5–5.1)
SODIUM: 138 mmol/L (ref 135–145)

## 2018-06-14 LAB — CBC
HCT: 38.1 % — ABNORMAL LOW (ref 40.0–52.0)
HCT: 42.8 % (ref 40.0–52.0)
Hemoglobin: 13.2 g/dL (ref 13.0–18.0)
Hemoglobin: 14.6 g/dL (ref 13.0–18.0)
MCH: 34.5 pg — ABNORMAL HIGH (ref 26.0–34.0)
MCH: 34.8 pg — ABNORMAL HIGH (ref 26.0–34.0)
MCHC: 34.7 g/dL (ref 32.0–36.0)
MCHC: 34.2 g/dL (ref 32.0–36.0)
MCV: 99.3 fL (ref 80.0–100.0)
MCV: 101.9 fL — AB (ref 80.0–100.0)
PLATELETS: 292 10*3/uL (ref 150–440)
PLATELETS: 289 10*3/uL (ref 150–440)
RBC: 3.83 MIL/uL — ABNORMAL LOW (ref 4.40–5.90)
RBC: 4.21 MIL/uL — AB (ref 4.40–5.90)
RDW: 15.4 % — ABNORMAL HIGH (ref 11.5–14.5)
RDW: 15.5 % — AB (ref 11.5–14.5)
WBC: 8.2 10*3/uL (ref 3.8–10.6)
WBC: 10 10*3/uL (ref 3.8–10.6)

## 2018-06-14 LAB — TROPONIN I
Troponin I: <0.03 ng/mL (ref <0.03)
Troponin I: <0.03 ng/mL (ref <0.03)

## 2018-06-14 LAB — GLUCOSE, CAPILLARY: GLUCOSE-CAPILLARY: 86 mg/dL (ref 70–99)

## 2018-06-14 LAB — CREATININE, SERUM
Creatinine, Ser: 0.92 mg/dL (ref 0.61–1.24)
GFR calc Af Amer: >60 mL/min (ref >60)
GFR calc non Af Amer: >60 mL/min (ref >60)

## 2018-06-14 LAB — MRSA PCR SCREENING: MRSA BY PCR: NEGATIVE

## 2018-06-14 MED ORDER — ACETAMINOPHEN 325 MG PO TABS: 650.0000 mg | ORAL_TABLET | ORAL | Status: DC | PRN

## 2018-06-14 MED ORDER — MORPHINE SULFATE (PF) 2 MG/ML IV SOLN: 2.0000 mg | INTRAVENOUS | Status: DC | PRN

## 2018-06-14 MED ORDER — ASPIRIN 81 MG PO CHEW
324.0000 mg | CHEWABLE_TABLET | Freq: Once | ORAL | Status: AC
  Administered 2018-06-14: 324 mg via ORAL
  Filled 2018-06-14: qty 4

## 2018-06-14 MED ORDER — ENOXAPARIN SODIUM 40 MG/0.4ML ~~LOC~~ SOLN: 40.0000 mg | SUBCUTANEOUS | Status: DC

## 2018-06-14 MED ORDER — LATANOPROST 0.005 % OP SOLN
1.0000 [drp] | Freq: Every day | OPHTHALMIC | Status: DC
  Filled 2018-06-14: qty 2.5

## 2018-06-14 MED ORDER — LINAGLIPTIN 5 MG PO TABS: 5.0000 mg | ORAL_TABLET | Freq: Every day | ORAL | Status: DC

## 2018-06-14 MED ORDER — ONDANSETRON HCL 4 MG/2ML IJ SOLN: 4.0000 mg | Freq: Four times a day (QID) | INTRAMUSCULAR | Status: DC | PRN

## 2018-06-14 MED ORDER — METOPROLOL TARTRATE 25 MG PO TABS
25.0000 mg | ORAL_TABLET | Freq: Two times a day (BID) | ORAL | Status: DC
  Administered 2018-06-14: 25 mg via ORAL
  Filled 2018-06-14: qty 1

## 2018-06-14 MED ORDER — NITROGLYCERIN 2 % TD OINT
1.0000 [in_us] | TOPICAL_OINTMENT | Freq: Once | TRANSDERMAL | Status: AC
  Administered 2018-06-14: 1 [in_us] via TOPICAL
  Filled 2018-06-14: qty 1

## 2018-06-14 MED ORDER — DIVALPROEX SODIUM 500 MG PO DR TAB
500.0000 mg | DELAYED_RELEASE_TABLET | Freq: Two times a day (BID) | ORAL | Status: DC
  Filled 2018-06-14: qty 1

## 2018-06-14 MED ORDER — ASPIRIN EC 325 MG PO TBEC: 325.0000 mg | DELAYED_RELEASE_TABLET | Freq: Every day | ORAL | Status: DC

## 2018-06-14 MED ORDER — INSULIN ASPART 100 UNIT/ML ~~LOC~~ SOLN: 0.0000 [IU] | Freq: Three times a day (TID) | SUBCUTANEOUS | Status: DC

## 2018-06-14 MED ORDER — TRAMADOL HCL 50 MG PO TABS: 50.0000 mg | ORAL_TABLET | Freq: Four times a day (QID) | ORAL | Status: DC | PRN

## 2018-06-14 NOTE — Consult Note (Signed)
Reason for Consult: Chest pain angina abnormal EKG Referring Physician: Dr. Fritzi Mandes hospitalist  Frank Dean is an 62 y.o. male.  HPI: Patient is a 62 year old black male moved from [place] history of diabetes smoking schizophrenia hypertension who presented with acute chest pain symptoms and was found to have an abnormal EKG disease admitted for further assessment troponins were unremarkable on admission but because of his risk factors and chest pain symptoms and abnormal EKG he was admitted for further assessment  Past Medical History:  Diagnosis Date  . Bronchitis   . Glaucoma   . Paranoid ([place])   . Schizo affective schizophrenia [facility])     Past Surgical History:  Procedure Laterality Date  . MANDIBLE FRACTURE SURGERY      History reviewed. No pertinent family history.  Social History:  reports that he has been smoking cigarettes.  He has been smoking about 0.50 packs per day. He has never used smokeless tobacco. He reports that he does not drink alcohol or use drugs.  Allergies: No Known Allergies  Medications: I have reviewed the patient's current medications.  Results for orders placed or performed during the hospital encounter of 06/14/18 (from the past 48 hour(s))  Basic metabolic panel     Status: Abnormal   Collection Time: 06/14/18 11:23 AM  Result Value Ref Range   Sodium 138 135 - 145 mmol/L   Potassium 3.9 3.5 - 5.1 mmol/L   Chloride 105 98 - 111 mmol/L    Comment: Please note change in reference range.   CO2 24 22 - 32 mmol/L   Glucose, Bld 128 (H) 70 - 99 mg/dL    Comment: Please note change in reference range.   BUN 10 8 - 23 mg/dL    Comment: Please note change in reference range.   Creatinine, Ser 0.97 0.61 - 1.24 mg/dL   Calcium 9.2 8.9 - 10.3 mg/dL   GFR calc non Af Amer >60 >60 mL/min   GFR calc Af Amer >60 >60 mL/min    Comment: (NOTE) The eGFR has been calculated using the CKD EPI equation. This calculation has not been validated in all  clinical situations. eGFR's persistently <60 mL/min signify possible Chronic Kidney Disease.    Anion gap 9 5 - 15    Comment: Performed at [facility], [address] [place], [place] [address]  CBC     Status: Abnormal   Collection Time: 06/14/18 11:23 AM  Result Value Ref Range   WBC 8.2 3.8 - 10.6 K/uL   RBC 3.83 (L) 4.40 - 5.90 MIL/uL   Hemoglobin 13.2 13.0 - 18.0 g/dL   HCT 38.1 (L) 40.0 - 52.0 %   MCV 99.3 80.0 - 100.0 fL   MCH 34.5 (H) 26.0 - 34.0 pg   MCHC 34.7 32.0 - 36.0 g/dL   RDW 15.4 (H) 11.5 - 14.5 %   Platelets 292 150 - 440 K/uL    Comment: Performed at [facility], [address] [place], [place] [address]  Troponin I     Status: None   Collection Time: 06/14/18 11:23 AM  Result Value Ref Range   Troponin I <0.03 <0.03 ng/mL    Comment: Performed at [facility], [address] [place], [place] [address]  CBC     Status: Abnormal   Collection Time: 06/14/18  3:37 PM  Result Value Ref Range   WBC 10.0 3.8 - 10.6 K/uL   RBC 4.21 (L) 4.40 - 5.90 MIL/uL  Hemoglobin 14.6 13.0 - 18.0 g/dL   HCT 42.8 40.0 - 52.0 %   MCV 101.9 (H) 80.0 - 100.0 fL   MCH 34.8 (H) 26.0 - 34.0 pg   MCHC 34.2 32.0 - 36.0 g/dL   RDW 15.5 (H) 11.5 - 14.5 %   Platelets 289 150 - 440 K/uL    Comment: Performed at [facility], [address] [place], [place] [address]  Creatinine, serum     Status: None   Collection Time: 06/14/18  3:37 PM  Result Value Ref Range   Creatinine, Ser 0.92 0.61 - 1.24 mg/dL   GFR calc non Af Amer >60 >60 mL/min   GFR calc Af Amer >60 >60 mL/min    Comment: (NOTE) The eGFR has been calculated using the CKD EPI equation. This calculation has not been validated in all clinical situations. eGFR's persistently <60 mL/min signify possible Chronic Kidney Disease. Performed at [facility], [address] [place], [place] [address]   Troponin I-serum (0, 3, 6 hours)     Status: None   Collection Time:  06/14/18  3:37 PM  Result Value Ref Range   Troponin I <0.03 <0.03 ng/mL    Comment: Performed at [facility], [address] [place], [place] [address]  Glucose, capillary     Status: None   Collection Time: 06/14/18  3:59 PM  Result Value Ref Range   Glucose-Capillary 86 70 - 99 mg/dL   Comment 1 Notify RN    Comment 2 Document in Chart     Dg Chest 2 View  Result Date: 06/14/2018 CLINICAL DATA:  Left-sided chest pain beginning several days ago with shortness of breath and nausea. EXAM: CHEST - 2 VIEW COMPARISON:  06/21/2014 FINDINGS: Heart size is normal. Mediastinal shadows are normal. Lungs are clear. The vascularity is normal. No infiltrate, mass, effusion or collapse. No significant bone finding. IMPRESSION: No active cardiopulmonary disease. Electronically Signed   By: Nelson Chimes M.D.   On: 06/14/2018 11:49    Review of Systems  Constitutional: Positive for diaphoresis and malaise/fatigue.  HENT: Positive for congestion.   Eyes: Negative.   Respiratory: Positive for shortness of breath.   Cardiovascular: Positive for chest pain.  Gastrointestinal: Positive for heartburn.  Genitourinary: Negative.   Musculoskeletal: Negative.   Skin: Negative.   Neurological: Negative.   Endo/Heme/Allergies: Negative.   Psychiatric/Behavioral: Negative.    Blood pressure 124/87, pulse 75, temperature 97.6 F (36.4 C), temperature source Oral, resp. rate 18, height 5' 6"  (1.676 m), weight 153 lb (69.4 kg), SpO2 98 %. Physical Exam  Nursing note and vitals reviewed. Constitutional: He is oriented to person, place, and time. He appears well-developed and well-nourished.  HENT:  Head: Normocephalic and atraumatic.  Eyes: Pupils are equal, round, and reactive to light. Conjunctivae and EOM are normal.  Neck: Normal range of motion. Neck supple.  Cardiovascular: Normal rate, regular rhythm and normal heart sounds.  Respiratory: Effort normal and breath sounds normal.  GI:  Soft. Bowel sounds are normal.  Musculoskeletal: Normal range of motion.  Neurological: He is alert and oriented to person, place, and time. He has normal reflexes.  Skin: Skin is warm and dry.  Psychiatric: He has a normal mood and affect.    Assessment/Plan: Angina Chest pain Abnormal Ekg Diabetes type II Bronchitis Smoking Paranoid schizophrenia Glaucoma . Agree with admit to telemetry rule out microinfarction Follow-up EKGs and troponins Echocardiogram will be helpful for further assessment Functional study for assessment of  possible ischemia DVT prophylaxis Continue eyedrops for glaucoma Aspirin nitrates beta-blockers Advised patient refrain from tobacco abuse If functional study negative would recommend discharge and follow-up as an outpatient   Dwayne D Callwood 06/14/2018, 5:43 PM

## 2018-06-14 NOTE — ED Notes (Signed)
Pt lives in a resthome - [facility] - [phone] Caregiver name is Rosey Batheresa - [phone]

## 2018-06-14 NOTE — ED Notes (Signed)
Attempted to call report and was advised that the nurse would return the call 

## 2018-06-14 NOTE — ED Triage Notes (Signed)
Pt presents to ED c/o L-sided chest pain since Saturday with SOB and nausea. Hx MI 2015. Current smoker.

## 2018-06-14 NOTE — ED Notes (Signed)
Pt reports that the chest pain started Saturday - he reports that first he felt pain in bilat lower abd that radiated into back and made his legs weak - then the pain moved to the left side of his chest and radiated down left arm and into left jaw - pt reports shortness of breath and dizziness that comes and goes

## 2018-06-14 NOTE — ED Provider Notes (Signed)
[facility] Emergency Department Provider Note       Time seen: ----------------------------------------- 11:53 AM on 06/14/2018 -----------------------------------------   I have reviewed the triage vital signs and the nursing notes.  HISTORY   Chief Complaint Chest Pain    HPI Frank Dean is a 61 y.o. male with a history of bronchitis, schizophrenia who presents to the ED for left-sided chest pain since Saturday with shortness of breath and nausea.  He does have a history of heart attack 4 years ago.  He currently smokes, denies alcohol or drug use.  He denies fevers, chills or other complaints.  Past Medical History:  Diagnosis Date  . Bronchitis   . Glaucoma   . Paranoid ([facility])   . Schizo affective schizophrenia ([facility])     There are no active problems to display for this patient.   Past Surgical History:  Procedure Laterality Date  . MANDIBLE FRACTURE SURGERY      Allergies Patient has no known allergies.  Social History Social History   Tobacco Use  . Smoking status: Current Every Day Smoker    Packs/day: 0.50    Types: Cigarettes  . Smokeless tobacco: Never Used  Substance Use Topics  . Alcohol use: No  . Drug use: No   Review of Systems Constitutional: Negative for fever. Cardiovascular: Positive for chest pain Respiratory: Positive for shortness of breath Gastrointestinal: Negative for abdominal pain, vomiting and diarrhea. Musculoskeletal: Negative for back pain. Skin: Negative for rash. Neurological: Negative for headaches, focal weakness or numbness.  All systems negative/normal/unremarkable except as stated in the HPI  ____________________________________________   PHYSICAL EXAM:  VITAL SIGNS: ED Triage Vitals  Enc Vitals Group     BP 06/14/18 1124 113/67     Pulse Rate 06/14/18 1124 80     Resp 06/14/18 1124 18     Temp 06/14/18 1124 97.8 F (36.6 C)     Temp Source 06/14/18 1124 Oral     SpO2 06/14/18  1124 97 %     Weight 06/14/18 1125 163 lb (73.9 kg)     Height 06/14/18 1125 5\' 6"  (1.676 m)     Head Circumference --      Peak Flow --      Pain Score 06/14/18 1125 8     Pain Loc --      Pain Edu? --      Excl. in GC? --    Constitutional: Alert and oriented. Well appearing and in no distress. Eyes: Conjunctivae are normal. Normal extraocular movements. Cardiovascular: Normal rate, regular rhythm. No murmurs, rubs, or gallops. Respiratory: Normal respiratory effort without tachypnea nor retractions. Breath sounds are clear and equal bilaterally. No wheezes/rales/rhonchi. Gastrointestinal: Soft and nontender. Normal bowel sounds Musculoskeletal: Nontender with normal range of motion in extremities. No lower extremity tenderness nor edema. Neurologic:  Normal speech and language. No gross focal neurologic deficits are appreciated.  Skin:  Skin is warm, dry and intact. No rash noted. Psychiatric: Mood and affect are normal. Speech and behavior are normal.  ____________________________________________  EKG: Interpreted by me.  Sinus rhythm the rate of 83 bpm, normal PR interval, normal QRS size, normal QT, T wave inversions are noted  ____________________________________________  ED COURSE:  As part of my medical decision making, I reviewed the following data within the electronic MEDICAL RECORD NUMBER History obtained from family if available, nursing notes, old chart and ekg, as well as notes from prior ED visits. Patient presented for chest pain, we will assess  with labs and imaging as indicated at this time.   Procedures ____________________________________________   LABS (pertinent positives/negatives)  Labs Reviewed  CBC - Abnormal; Notable for the following components:      Result Value   RBC 3.83 (*)    HCT 38.1 (*)    MCH 34.5 (*)    RDW 15.4 (*)    All other components within normal limits  BASIC METABOLIC PANEL  TROPONIN I    RADIOLOGY  Chest x-ray is  normal  ____________________________________________  DIFFERENTIAL DIAGNOSIS   Unstable angina, MI, PE, GERD, anxiety  FINAL ASSESSMENT AND PLAN  Chest pain, EKG changes   Plan: The patient had presented for chest pain and was found to have new EKG changes compared to 2015.  Patient states in 2015 he had an MI but it was before the prior EKG on record. Patient's labs were reassuring. Patient's imaging was also reassuring.  He would benefit from full cardiac evaluation and possibly cardiology consultation.   Ulice DashJohnathan E Barth Trella, MD   Note: This note was generated in part or whole with voice recognition software. Voice recognition is usually quite accurate but there are transcription errors that can and very often do occur. I apologize for any typographical errors that were not detected and corrected.     Emily FilbertWilliams, Adalee Kathan E, MD 06/14/18 1225

## 2018-06-14 NOTE — ED Notes (Addendum)
Pt placed on monitoring equipment and this nurse was going to start IV (MD was at bedside) - the pt was informed by MD that he thought this chest pain was heart related and that the pt would need to spent the night in the hospital - the pt stated that he was not willing to have test and that he wanted to go home - pt requested that person who brought him to the ED come back to room so he could discuss it with her - "Rosey Batheresa" brought back to pt room and Dr Mayford KnifeWilliams notified - Dr Mayford KnifeWilliams came out of room and stated that the pt was willing to stay at this time

## 2018-06-14 NOTE — Progress Notes (Signed)
Patient says he's been waiting 30 minutes for utensils to eat his meal.  He thinks this is disrespectful and is going home right now..  He signed an AMA form.  IV was Dc'd and telebox removed.  Dr. Allena KatzPatel notified.

## 2018-06-14 NOTE — ED Notes (Signed)
Carollee HerterShannon RN attempted IV ultrasound x3 without success - Greg RN notified and will attempt IV by ultrasound

## 2018-06-14 NOTE — H&P (Signed)
[facility] [place] [facility]   PATIENT NAME: Frank Dean    MR#:  [id number]  DATE OF BIRTH:  02-26-1956  DATE OF ADMISSION:  06/14/2018  PRIMARY CARE PHYSICIAN: [facility]   REQUESTING/REFERRING PHYSICIAN: dr Orlinda Blalockwillilams  CHIEF COMPLAINT:   Chest pain on and off for couple days HISTORY OF PRESENT ILLNESS:  Frank Dean  is a 62 y.o. male with a known history of type II diabetes, glaucoma, depression comes to the emergency room with right leg pain and started having some chest pain with substernal. Denies any shortness of breath diaphoresis. Patient had stress [place] in 2015 and according to him it was okay. Patient showed new EKG changes with flipped T-wave's and inferior in her lateral leads. These are new since 2015 EKG. Troponin negative. Chest pain free at present. He is being admitted for further voucher management.  PAST MEDICAL HISTORY:   Past Medical History:  Diagnosis Date  . Bronchitis   . Glaucoma   . Paranoid (HCC)   . Schizo affective schizophrenia (HCC)     PAST SURGICAL HISTOIRY:   Past Surgical History:  Procedure Laterality Date  . MANDIBLE FRACTURE SURGERY      SOCIAL HISTORY:   Social History   Tobacco Use  . Smoking status: Current Every Day Smoker    Packs/day: 0.50    Types: Cigarettes  . Smokeless tobacco: Never Used  Substance Use Topics  . Alcohol use: No    FAMILY HISTORY:  History reviewed. No pertinent family history.  DRUG ALLERGIES:  No Known Allergies  REVIEW OF SYSTEMS:  Review of Systems  Constitutional: Negative for chills, fever and weight loss.  HENT: Negative for ear discharge, ear pain and nosebleeds.   Eyes: Negative for blurred vision, pain and discharge.  Respiratory: Negative for sputum production, shortness of breath, wheezing and stridor.   Cardiovascular: Positive for chest pain. Negative for palpitations, orthopnea and  PND.  Gastrointestinal: Negative for abdominal pain, diarrhea, nausea and vomiting.  Genitourinary: Negative for frequency and urgency.  Musculoskeletal: Positive for joint pain. Negative for back pain.  Neurological: Negative for sensory change, speech change, focal weakness and weakness.  Psychiatric/Behavioral: Negative for depression and hallucinations. The patient is not nervous/anxious.      MEDICATIONS AT HOME:   Prior to Admission medications   Medication Sig Start Date End Date Taking? Authorizing Provider  divalproex (DEPAKOTE) 500 MG DR tablet Take 500 mg by mouth 2 (two) times daily.    Yes [provider]  latanoprost (XALATAN) 0.005 % ophthalmic solution Place 1 drop into both eyes at bedtime.   Yes [provider]  metFORMIN (GLUCOPHAGE) 500 MG tablet Take 500 mg by mouth 2 (two) times daily with a meal.   Yes [provider]  sitaGLIPtin (JANUVIA) 50 MG tablet Take 50 mg by mouth daily.   Yes [provider]  traMADol (ULTRAM) 50 MG tablet Take 50 mg by mouth every 4 (four) hours as needed. 06/13/18  Yes [provider]      VITAL SIGNS:  Blood pressure 124/75, pulse 80, temperature 97.8 F (36.6 C), temperature source Oral, resp. rate 13, height 5\' 6"  (1.676 m), weight 73.9 kg (163 lb), SpO2 97 %.  PHYSICAL EXAMINATION:  GENERAL:  62 y.o.-year-old patient lying in the bed with no acute distress.  EYES: Pupils equal, round, reactive to light and accommodation. No scleral icterus. Extraocular muscles intact.  HEENT:  Head atraumatic, normocephalic. Oropharynx and nasopharynx clear.  NECK:  Supple, no jugular venous distention. No thyroid enlargement, no tenderness.  LUNGS: Normal breath sounds bilaterally, no wheezing, rales,rhonchi or crepitation. No use of accessory muscles of respiration.  CARDIOVASCULAR: S1, S2 normal. No murmurs, rubs, or gallops.  ABDOMEN: Soft, nontender, nondistended. Bowel sounds present. No  organomegaly or mass.  EXTREMITIES: No pedal edema, cyanosis, or clubbing.  NEUROLOGIC: Cranial nerves II through XII are intact. Muscle strength 5/5 in all extremities. Sensation intact. Gait not checked.  PSYCHIATRIC: The patient is alert and oriented x 3.  SKIN: No obvious rash, lesion, or ulcer.   LABORATORY PANEL:   CBC Recent Labs  Lab 06/14/18 1123  WBC 8.2  HGB 13.2  HCT 38.1*  PLT 292   ------------------------------------------------------------------------------------------------------------------  Chemistries  Recent Labs  Lab 06/14/18 1123  NA 138  K 3.9  CL 105  CO2 24  GLUCOSE 128*  BUN 10  CREATININE 0.97  CALCIUM 9.2   ------------------------------------------------------------------------------------------------------------------  Cardiac Enzymes Recent Labs  Lab 06/14/18 1123  TROPONINI <0.03   ------------------------------------------------------------------------------------------------------------------  RADIOLOGY:  Dg Chest 2 View  Result Date: 06/14/2018 CLINICAL DATA:  Left-sided chest pain beginning several days ago with shortness of breath and nausea. EXAM: CHEST - 2 VIEW COMPARISON:  06/21/2014 FINDINGS: Heart size is normal. Mediastinal shadows are normal. Lungs are clear. The vascularity is normal. No infiltrate, mass, effusion or collapse. No significant bone finding. IMPRESSION: No active cardiopulmonary disease. Electronically Signed   By: Paulina Fusi M.D.   On: 06/14/2018 11:49    EKG:   Normal sinus rhythm. T-wave inversion in inferior and enter lateral leads new since 2015 IMPRESSION AND PLAN:   Frank Dean  is a 62 y.o. male with a known history of type II diabetes, glaucoma, depression comes to the emergency room with right leg pain and started having some chest pain with substernal. Denies any shortness of breath diaphoresis  1. chest pain with abnormal EKG -admit to telemetry floor -cardiac enzymes times  three -cardiology consultation with Dr. Juliann Pares. Discussed with him -will schedule myoview stress test tomorrow. If patient rules in then consider cardiac catheterization after discussing with cardiology. -Aspirin, sublingual nitro, beta blockers -check lipid profile  2. type II diabetes -sliding scale insulin -hold metformin in case patient needs procedure  3. History of glaucoma continue eyedrops  4. DVT prophylaxis Lovenox    All the records are reviewed and case discussed with ED provider. Management plans discussed with the patient, family and they are in agreement.  CODE STATUS: full  TOTAL TIME TAKING CARE OF THIS PATIENT: 40** minutes.    Enedina Finner M.D on 06/14/2018 at 1:57 PM  Between 7am to 6pm - Pager - [phone]  After 6pm go to [email or web address] - password [email or web address] [facility]  [facility]  Office  [phone]  CC: Primary care physician; [facility]

## 2018-06-14 NOTE — ED Notes (Signed)
Carollee HerterShannon RN in room attempting IV by ultrasound

## 2018-06-14 NOTE — Progress Notes (Signed)
Admitted from the ED for chest pain and EKG changes.  Other than the chest pain his review of systems is WNL.  Patient seems anxious or self conscious/ defensive about being in the hospital and being asked personal medical questions.  He explained things in much more detail than necessary as if justifying his answers.

## 2018-06-29 NOTE — Discharge Summary (Signed)
Frank Dean  is a 62 y.o. male with a known history of type II diabetes, glaucoma, depression comes to the emergency room with right leg pain and started having some chest pain with substernal. Denies any shortness of breath diaphoresis. Patient had stress test tread[place] done in [place] in 2015 and according to him it was okay. Patient showed new EKG changes with flipped T-wave's and inferior in her lateral leads. These are new since 2015 EKG. Troponin negative  Patient was admitted on the telemetry floor. He got upset for some reason and decided to leave AMA.

## 2018-08-01 ENCOUNTER — Emergency Department: Payer: Medicaid Other

## 2018-08-01 ENCOUNTER — Other Ambulatory Visit: Payer: Self-pay

## 2018-08-01 ENCOUNTER — Encounter: Payer: Self-pay | Admitting: *Deleted

## 2018-08-01 ENCOUNTER — Emergency Department
Admission: EM | Admit: 2018-08-01 | Discharge: 2018-08-01 | Disposition: A | Discharge status: Home / Self Care | Payer: Medicaid Other | Attending: Emergency Medicine | Admitting: Emergency Medicine

## 2018-08-01 DIAGNOSIS — F17210 Nicotine dependence, cigarettes, uncomplicated: Secondary | ICD-10-CM | POA: Diagnosis not present

## 2018-08-01 DIAGNOSIS — R109 Unspecified abdominal pain: Secondary | ICD-10-CM | POA: Diagnosis not present

## 2018-08-01 DIAGNOSIS — M5431 Sciatica, right side: Secondary | ICD-10-CM | POA: Insufficient documentation

## 2018-08-01 LAB — COMPREHENSIVE METABOLIC PANEL
ALBUMIN: 4.3 g/dL (ref 3.5–5.0)
ALK PHOS: 66 U/L (ref 38–126)
ALT: 15 U/L (ref 0–44)
ANION GAP: 7 (ref 5–15)
AST: 18 U/L (ref 15–41)
BUN: 9 mg/dL (ref 8–23)
CALCIUM: 9.5 mg/dL (ref 8.9–10.3)
CO2: 25 mmol/L (ref 22–32)
Chloride: 104 mmol/L (ref 98–111)
Creatinine, Ser: 0.92 mg/dL (ref 0.61–1.24)
GFR calc non Af Amer: >60 mL/min (ref >60)
GLUCOSE: 150 mg/dL — AB (ref 70–99)
Potassium: 4.7 mmol/L (ref 3.5–5.1)
SODIUM: 136 mmol/L (ref 135–145)
TOTAL PROTEIN: 7.4 g/dL (ref 6.5–8.1)
Total Bilirubin: 0.6 mg/dL (ref 0.3–1.2)

## 2018-08-01 LAB — URINALYSIS, COMPLETE (UACMP) WITH MICROSCOPIC
BACTERIA UA: NONE SEEN
Bilirubin Urine: NEGATIVE
GLUCOSE, UA: NEGATIVE mg/dL
HGB URINE DIPSTICK: NEGATIVE
KETONES UR: NEGATIVE mg/dL
Leukocytes, UA: NEGATIVE
NITRITE: NEGATIVE
PROTEIN: NEGATIVE mg/dL
Specific Gravity, Urine: 1.008 (ref 1.005–1.030)
pH: 6 (ref 5.0–8.0)

## 2018-08-01 LAB — CBC
HEMATOCRIT: 38.3 % — AB (ref 40.0–52.0)
HEMOGLOBIN: 13.4 g/dL (ref 13.0–18.0)
MCH: 34.8 pg — AB (ref 26.0–34.0)
MCHC: 34.9 g/dL (ref 32.0–36.0)
MCV: 100 fL (ref 80.0–100.0)
Platelets: 282 10*3/uL (ref 150–440)
RBC: 3.83 MIL/uL — AB (ref 4.40–5.90)
RDW: 14.7 % — ABNORMAL HIGH (ref 11.5–14.5)
WBC: 10 10*3/uL (ref 3.8–10.6)

## 2018-08-01 LAB — LIPASE, BLOOD: Lipase: 44 U/L (ref 11–51)

## 2018-08-01 MED ORDER — TRAMADOL HCL 50 MG PO TABS: 50.0000 mg | ORAL_TABLET | Freq: Four times a day (QID) | ORAL | 0 refills | Status: AC | PRN

## 2018-08-01 MED ORDER — PREDNISONE 20 MG PO TABS
60.0000 mg | ORAL_TABLET | Freq: Once | ORAL | Status: AC
  Administered 2018-08-01: 60 mg via ORAL
  Filled 2018-08-01: qty 3

## 2018-08-01 MED ORDER — PREDNISONE 10 MG (21) PO TBPK: ORAL_TABLET | Freq: Every day | ORAL | 0 refills | Status: AC

## 2018-08-01 NOTE — ED Provider Notes (Signed)
[facility] Emergency Department Provider Note       Time seen: ----------------------------------------- 3:17 PM on 08/01/2018 -----------------------------------------   I have reviewed the triage vital signs and the nursing notes.  HISTORY   Chief Complaint Flank Pain and Abdominal Pain    HPI Frank Dean is a 62 y.o. male with a history of arthritis, glaucoma, schizophrenia who presents to the ED for flank pain.  Patient is right flank pain and right leg pain for the past 2 days.  He went to an urgent care and they did urine which was normal.  He was sent here for further evaluation.  He states he is moving his bowels well, denies fevers, chills, vomiting or diarrhea.  He has never had pain like this before.  Past Medical History:  Diagnosis Date  . Bronchitis   . Glaucoma   . Paranoid (HCC)   . Schizo affective schizophrenia [facility](HCC)     Patient Active Problem List   Diagnosis Date Noted  . Chest pain 06/14/2018    Past Surgical History:  Procedure Laterality Date  . MANDIBLE FRACTURE SURGERY      Allergies Patient has no known allergies.  Social History Social History   Tobacco Use  . Smoking status: Current Every Day Smoker    Packs/day: 0.50    Types: Cigarettes  . Smokeless tobacco: Never Used  Substance Use Topics  . Alcohol use: No  . Drug use: No   Review of Systems Constitutional: Negative for fever. Cardiovascular: Negative for chest pain. Respiratory: Negative for shortness of breath. Gastrointestinal: Positive for flank pain Musculoskeletal: Positive for right thigh pain Skin: Negative for rash. Neurological: Negative for headaches, focal weakness or numbness.  All systems negative/normal/unremarkable except as stated in the HPI  ____________________________________________   PHYSICAL EXAM:  VITAL SIGNS: ED Triage Vitals  Enc Vitals Group     BP 08/01/18 1316 (!) 149/82     Pulse Rate 08/01/18 1316 89    Resp 08/01/18 1316 16     Temp 08/01/18 1316 98.1 F (36.7 C)     Temp Source 08/01/18 1316 Oral     SpO2 08/01/18 1316 100 %     Weight 08/01/18 1317 160 lb (72.6 kg)     Height 08/01/18 1317 5\' 6"  (1.676 m)     Head Circumference --      Peak Flow --      Pain Score 08/01/18 1317 7     Pain Loc --      Pain Edu? --      Excl. in GC? --    Constitutional: Alert and oriented. Well appearing and in no distress. Eyes: Conjunctivae are normal. Normal extraocular movements. Cardiovascular: Normal rate, regular rhythm. No murmurs, rubs, or gallops. Respiratory: Normal respiratory effort without tachypnea nor retractions. Breath sounds are clear and equal bilaterally. No wheezes/rales/rhonchi. Gastrointestinal: Soft and nontender. Normal bowel sounds Musculoskeletal: Nontender with normal range of motion in extremities. No lower extremity tenderness nor edema. Neurologic:  Normal speech and language. No gross focal neurologic deficits are appreciated.  Skin:  Skin is warm, dry and intact. No rash noted. Psychiatric: Mood and affect are normal. Speech and behavior are normal.  ____________________________________________  ED COURSE:  As part of my medical decision making, I reviewed the following data within the electronic MEDICAL RECORD NUMBER History obtained from family if available, nursing notes, old chart and ekg, as well as notes from prior ED visits. Patient presented for right flank pain, we  will assess with labs and imaging as indicated at this time.   Procedures ____________________________________________   LABS (pertinent positives/negatives)  Labs Reviewed  COMPREHENSIVE METABOLIC PANEL - Abnormal; Notable for the following components:      Result Value   Glucose, Bld 150 (*)    All other components within normal limits  CBC - Abnormal; Notable for the following components:   RBC 3.83 (*)    HCT 38.3 (*)    MCH 34.8 (*)    RDW 14.7 (*)    All other components within  normal limits  URINALYSIS, COMPLETE (UACMP) WITH MICROSCOPIC - Abnormal; Notable for the following components:   Color, Urine YELLOW (*)    APPearance CLEAR (*)    All other components within normal limits  LIPASE, BLOOD    RADIOLOGY Images were viewed by me  CT renal protocol IMPRESSION: 1. Negative for hydronephrosis or ureteral stone. Negative for acute appendicitis. 2. Slightly thick-walled appearance of the bladder is questionable for a mild cystitis versus chronic obstruction 3. Scattered colon diverticula without acute inflammatory change.  ____________________________________________  DIFFERENTIAL DIAGNOSIS   Musculoskeletal pain, renal colic, UTI, pyelonephritis, constipation  FINAL ASSESSMENT AND PLAN  Flank pain, sciatica   Plan: The patient had presented for right flank pain. Patient's labs did not reveal any acute process. Patient's imaging did not reveal any acute process, there are some degenerative changes.  He was started on prednisone will be discharged with pain medicine to take as needed.  He is cleared for outpatient follow-up.   Ulice DashJohnathan E Ediel Unangst, MD   Note: This note was generated in part or whole with voice recognition software. Voice recognition is usually quite accurate but there are transcription errors that can and very often do occur. I apologize for any typographical errors that were not detected and corrected.     Emily FilbertWilliams, Amonte Brookover E, MD 08/01/18 [phone]

## 2018-08-01 NOTE — ED Triage Notes (Signed)
Flank pain and right lower quad pain for 2 days. Went to urgent care and they did urine which was okay.  Sent t here for further evaluation

## 2018-08-01 NOTE — ED Notes (Signed)
Patient transported to CT 

## 2018-08-13 ENCOUNTER — Encounter (HOSPITAL_COMMUNITY): Payer: Self-pay | Admitting: Emergency Medicine

## 2018-08-13 ENCOUNTER — Emergency Department (HOSPITAL_COMMUNITY)
Admission: EM | Admit: 2018-08-13 | Discharge: 2018-08-14 | Disposition: A | Discharge status: Home / Self Care | Payer: Medicaid Other | Attending: Emergency Medicine | Admitting: Emergency Medicine

## 2018-08-13 ENCOUNTER — Other Ambulatory Visit: Payer: Self-pay

## 2018-08-13 DIAGNOSIS — Z87891 Personal history of nicotine dependence: Secondary | ICD-10-CM | POA: Diagnosis not present

## 2018-08-13 DIAGNOSIS — Z7984 Long term (current) use of oral hypoglycemic drugs: Secondary | ICD-10-CM | POA: Insufficient documentation

## 2018-08-13 DIAGNOSIS — M7918 Myalgia, other site: Secondary | ICD-10-CM

## 2018-08-13 DIAGNOSIS — Z79899 Other long term (current) drug therapy: Secondary | ICD-10-CM | POA: Diagnosis not present

## 2018-08-13 DIAGNOSIS — R109 Unspecified abdominal pain: Secondary | ICD-10-CM | POA: Insufficient documentation

## 2018-08-13 HISTORY — DX: Cardiac arrest, cause unspecified: I46.9

## 2018-08-13 LAB — URINALYSIS, ROUTINE W REFLEX MICROSCOPIC
BILIRUBIN URINE: NEGATIVE
Glucose, UA: NEGATIVE mg/dL
HGB URINE DIPSTICK: NEGATIVE
Ketones, ur: NEGATIVE mg/dL
Leukocytes, UA: NEGATIVE
Nitrite: NEGATIVE
Protein, ur: NEGATIVE mg/dL
SPECIFIC GRAVITY, URINE: 1.011 (ref 1.005–1.030)
pH: 8 (ref 5.0–8.0)

## 2018-08-13 NOTE — ED Triage Notes (Signed)
Pt states he began having R sided flank pain for 3 hours. Denies urinary problems.

## 2018-08-14 MED ORDER — CYCLOBENZAPRINE HCL 10 MG PO TABS: 10.0000 mg | ORAL_TABLET | Freq: Three times a day (TID) | ORAL | 0 refills | Status: AC

## 2018-08-14 MED ORDER — PREDNISONE 20 MG PO TABS
40.0000 mg | ORAL_TABLET | Freq: Once | ORAL | Status: AC
  Administered 2018-08-14: 40 mg via ORAL
  Filled 2018-08-14: qty 2

## 2018-08-14 MED ORDER — DEXAMETHASONE 4 MG PO TABS: 4.0000 mg | ORAL_TABLET | Freq: Two times a day (BID) | ORAL | 0 refills | Status: AC

## 2018-08-14 MED ORDER — CYCLOBENZAPRINE HCL 10 MG PO TABS
10.0000 mg | ORAL_TABLET | Freq: Once | ORAL | Status: AC
  Administered 2018-08-14: 10 mg via ORAL
  Filled 2018-08-14: qty 1

## 2018-08-14 MED ORDER — ACETAMINOPHEN 500 MG PO TABS
1000.0000 mg | ORAL_TABLET | Freq: Once | ORAL | Status: AC
  Administered 2018-08-14: 1000 mg via ORAL
  Filled 2018-08-14: qty 2

## 2018-08-14 NOTE — ED Notes (Signed)
Pt wanted to wait in the lobby with his coffee.

## 2018-08-14 NOTE — Discharge Instructions (Addendum)
Your vital signs within normal limits with exception of your blood pressure being slightly elevated at 159/90.  Your oxygen level is 98% on room air.  Within normal limits by my interpretation.  Your urine test today is completely normal.  I have reviewed the studies from [facility].  I suspect that you have a musculoskeletal flank area pain.  Heating pad to the area will be helpful.  Please use 1000 mg of Tylenol every 4 hours as needed for pain.  Use Decadron 2 times daily.  Please use Flexeril 3 times daily for spasm pain. This medication may cause drowsiness. Please do not drink, drive, or participate in activity that requires concentration while taking this medication.  Please see your physicians at the [facility] next week for follow-up and recheck.

## 2018-08-14 NOTE — ED Provider Notes (Signed)
[facility] EMERGENCY DEPARTMENT Provider Note   CSN: [id number] Arrival date & time: 08/13/18  2133     History   Chief Complaint Chief Complaint  Patient presents with  . Flank Pain    HPI Frank Dean is a 62 y.o. male.  Patient is a 62 year old male who presents to the emergency department with a complaint of flank pain.  The patient states that he has a right area flank pain that at times radiates down to the right leg.  The pain is described as sharp, and at times feeling as though with arrows sticking in his flank.  This problem is been going off and on since October 10.  The patient does not recall any significant injury.  He has not had any problems with fever, nausea, vomiting, diarrhea, chills, changes in diet, no changes in medication.  It is of note that the patient was evaluated at the [facility] department approximately 2 weeks ago.  He had a negative CT scan at that time.  He had a negative urine test.  And it was determined that he had a musculoskeletal type pain.  Patient has a history of arthritis, schizophrenia, glaucoma, and bronchitis.  The history is provided by the patient.  Flank Pain  Pertinent negatives include no chest pain, no abdominal pain and no shortness of breath.    Past Medical History:  Diagnosis Date  . Bronchitis   . Cardiac arrest (HCC)   . Glaucoma   . Paranoid (HCC)   . Schizo affective schizophrenia [facility](HCC)     Patient Active Problem List   Diagnosis Date Noted  . Chest pain 06/14/2018    Past Surgical History:  Procedure Laterality Date  . MANDIBLE FRACTURE SURGERY          Home Medications    Prior to Admission medications   Medication Sig Start Date End Date Taking? Authorizing Provider  divalproex (DEPAKOTE) 500 MG DR tablet Take 500 mg by mouth 2 (two) times daily.     [provider]  latanoprost (XALATAN) 0.005 % ophthalmic solution Place 1 drop into both eyes at bedtime.    [provider]  metFORMIN (GLUCOPHAGE) 500 MG tablet Take 500 mg by mouth 2 (two) times daily with a meal.    [provider]  predniSONE (STERAPRED UNI-PAK 21 TAB) 10 MG (21) TBPK tablet Take by mouth daily. Dispense steroid taper pack as directed 08/01/18   Emily FilbertWilliams, Jonathan E, MD  sitaGLIPtin (JANUVIA) 50 MG tablet Take 50 mg by mouth daily.    [provider]  traMADol (ULTRAM) 50 MG tablet Take 50 mg by mouth every 4 (four) hours as needed. 06/13/18   [provider]  traMADol (ULTRAM) 50 MG tablet Take 1 tablet (50 mg total) by mouth every 6 (six) hours as needed. 08/01/18 08/01/19  Emily FilbertWilliams, Jonathan E, MD    Family History History reviewed. No pertinent family history.  Social History Social History   Tobacco Use  . Smoking status: Former Smoker    Packs/day: 0.50    Types: Cigarettes    Last attempt to quit: 07/23/2018    Years since quitting: 0.0  . Smokeless tobacco: Never Used  Substance Use Topics  . Alcohol use: No  . Drug use: No     Allergies   Patient has no known allergies.   Review of Systems Review of Systems  Constitutional: Negative for activity change.       All ROS Neg except as  noted in HPI  HENT: Negative for nosebleeds.   Eyes: Negative for photophobia and discharge.  Respiratory: Negative for cough, shortness of breath and wheezing.   Cardiovascular: Negative for chest pain and palpitations.  Gastrointestinal: Negative for abdominal pain and blood in stool.  Genitourinary: Positive for flank pain. Negative for dysuria, frequency and hematuria.  Musculoskeletal: Negative for arthralgias, back pain and neck pain.  Skin: Negative.   Neurological: Negative for dizziness, seizures and speech difficulty.  Psychiatric/Behavioral: Negative for confusion and hallucinations.     Physical Exam Updated Vital Signs BP (!) 159/90 (BP Location: Right Arm)   Pulse 73   Temp 97.8 F (36.6 C) (Oral)   Resp 17   Ht 5\' 6"  (1.676 m)    Wt 72.6 kg   BMI 25.82 kg/m   Physical Exam  Constitutional: He is oriented to person, place, and time. He appears well-developed and well-nourished.  Non-toxic appearance.  HENT:  Head: Normocephalic.  Right Ear: Tympanic membrane and external ear normal.  Left Ear: Tympanic membrane and external ear normal.  Eyes: Pupils are equal, round, and reactive to light. EOM and lids are normal.  Neck: Normal range of motion. Neck supple. Carotid bruit is not present.  Cardiovascular: Normal rate, regular rhythm, normal heart sounds, intact distal pulses and normal pulses.  Pulmonary/Chest: Breath sounds normal. No respiratory distress.  There is symmetrical rise and fall of the chest.  The patient speaks in complete sentences without problem.  Abdominal: Soft. Bowel sounds are normal. He exhibits no ascites and no mass. There is no tenderness. There is no guarding.  There is no ascites noted.  There is no palpable mass appreciated.  The pain is mostly on the right flank area.  I can reproduce some of the pain by attempted range of motion of the flank areas.  No pain to supple palpation.  There is no splenomegaly, and there is no organomegaly appreciated.  There is no pulsating mass appreciated.  Musculoskeletal: Normal range of motion.  Distal pulses are symmetrical.  There are no temperature changes of the extremities.  Lymphadenopathy:       Head (right side): No submandibular adenopathy present.       Head (left side): No submandibular adenopathy present.    He has no cervical adenopathy.  Neurological: He is alert and oriented to person, place, and time. He has normal strength. No cranial nerve deficit or sensory deficit.  Skin: Skin is warm and dry.  Psychiatric: He has a normal mood and affect. His speech is normal.  Nursing note and vitals reviewed.    ED Treatments / Results  Labs (all labs ordered are listed, but only abnormal results are displayed) Labs Reviewed  URINE  CULTURE  URINALYSIS, ROUTINE W REFLEX MICROSCOPIC    EKG None  Radiology No results found.  Procedures Procedures (including critical care time)  Medications Ordered in ED Medications - No data to display   Initial Impression / Assessment and Plan / ED Course  I have reviewed the triage vital signs and the nursing notes.  Pertinent labs & imaging results that were available during my care of the patient were reviewed by me and considered in my medical decision making (see chart for details).       Final Clinical Impressions(s) / ED Diagnoses MDM  Vital signs reviewed.  Pulse oximetry is 98% on room air.  Within normal limits by my interpretation.  I have reviewed the records from [facility].  I  have reviewed the results of the CT scan and lab work that was done at this facility.  The patient states that he was treated with steroids and short course of Ultram.  The patient states that he was having problems at his location of residence during that time and did not have the medications filled.  The patient further states that he started to feel better so he did not get those medicines filled.  Urinalysis today is completely normal.  I suspect that this is a musculoskeletal related pain.  Patient will again be treated with steroids, and muscle relaxer.  Patient will be asked to follow-up with [facility].  He will return to the emergency department if any changes in his condition, problems, or concerns.   Final diagnoses:  Flank pain  Musculoskeletal pain    ED Discharge Orders         Ordered    cyclobenzaprine (FLEXERIL) 10 MG tablet  3 times daily     08/14/18 0034    dexamethasone (DECADRON) 4 MG tablet  2 times daily with meals     08/14/18 0034           Ivery Quale, PA-C 08/14/18 1823    Mancel Bale, MD 08/14/18 2340

## 2018-08-15 LAB — URINE CULTURE: CULTURE: NO GROWTH

## 2018-08-23 ENCOUNTER — Encounter: Payer: Self-pay | Admitting: Student

## 2018-08-24 ENCOUNTER — Encounter: Admission: RE | Payer: Self-pay | Source: Ambulatory Visit

## 2018-08-24 ENCOUNTER — Ambulatory Visit: Admission: RE | Admit: 2018-08-24 | Payer: Self-pay | Source: Ambulatory Visit | Admitting: Internal Medicine

## 2018-08-24 HISTORY — DX: Rheumatoid arthritis, unspecified: M06.9

## 2018-08-24 HISTORY — DX: Type 2 diabetes mellitus without complications: E11.9

## 2018-08-24 HISTORY — DX: Unspecified osteoarthritis, unspecified site: M19.90

## 2018-08-24 SURGERY — COLONOSCOPY WITH PROPOFOL
Anesthesia: General

## 2019-03-10 ENCOUNTER — Other Ambulatory Visit: Payer: Self-pay

## 2019-03-10 ENCOUNTER — Encounter (HOSPITAL_COMMUNITY): Payer: Self-pay | Admitting: Emergency Medicine

## 2019-03-10 ENCOUNTER — Emergency Department (HOSPITAL_COMMUNITY)
Admission: EM | Admit: 2019-03-10 | Discharge: 2019-03-11 | Disposition: A | Discharge status: Home / Self Care | Payer: Medicaid Other | Attending: Emergency Medicine | Admitting: Emergency Medicine

## 2019-03-10 DIAGNOSIS — Z7984 Long term (current) use of oral hypoglycemic drugs: Secondary | ICD-10-CM | POA: Diagnosis not present

## 2019-03-10 DIAGNOSIS — R1032 Left lower quadrant pain: Secondary | ICD-10-CM | POA: Diagnosis present

## 2019-03-10 DIAGNOSIS — E119 Type 2 diabetes mellitus without complications: Secondary | ICD-10-CM | POA: Insufficient documentation

## 2019-03-10 DIAGNOSIS — N451 Epididymitis: Secondary | ICD-10-CM | POA: Diagnosis not present

## 2019-03-10 DIAGNOSIS — F17210 Nicotine dependence, cigarettes, uncomplicated: Secondary | ICD-10-CM | POA: Diagnosis not present

## 2019-03-10 DIAGNOSIS — Z79899 Other long term (current) drug therapy: Secondary | ICD-10-CM | POA: Insufficient documentation

## 2019-03-10 MED ORDER — IBUPROFEN 600 MG PO TABS: 600.0000 mg | ORAL_TABLET | Freq: Four times a day (QID) | ORAL | 0 refills | Status: AC | PRN

## 2019-03-10 MED ORDER — CEPHALEXIN 500 MG PO CAPS: 500.0000 mg | ORAL_CAPSULE | Freq: Two times a day (BID) | ORAL | 0 refills | Status: AC

## 2019-03-10 MED ORDER — CEPHALEXIN 500 MG PO CAPS
500.0000 mg | ORAL_CAPSULE | Freq: Once | ORAL | Status: AC
  Administered 2019-03-10: 500 mg via ORAL
  Filled 2019-03-10: qty 1

## 2019-03-10 NOTE — ED Triage Notes (Signed)
Pt POC Mrs Frank Dean [phone]  [facility]

## 2019-03-10 NOTE — ED Provider Notes (Signed)
[facility] EMERGENCY DEPARTMENT Provider Note   CSN: [id number] Arrival date & time: 03/10/19  2146    History   Chief Complaint Chief Complaint  Patient presents with  . Groin Pain    HPI Frank Dean is a 63 y.o. male.     Patient presents to the emergency department for evaluation of painful left testicle.  Patient reports that he noticed an aching pain earlier today that has worsened over the course of the day.  He denies injury.  He has not noticed any swelling.  He has not had any urinary symptoms.  He reports that he had a similar problem when he lived in [place] and had some type of cyst drained by urologist.  Patient is not sexually active.     Past Medical History:  Diagnosis Date  . Arthritis   . Diabetes mellitus without complication (HCC)   . Glaucoma   . Rheumatoid arthritis (HCC)     There are no active problems to display for this patient.   Past Surgical History:  Procedure Laterality Date  . scrotal abscess          Home Medications    Prior to Admission medications   Medication Sig Start Date End Date Taking? Authorizing Provider  cephALEXin (KEFLEX) 500 MG capsule Take 1 capsule (500 mg total) by mouth 2 (two) times daily. 03/10/19   Gilda Crease, MD  divalproex (DEPAKOTE) 500 MG DR tablet Take 500 mg by mouth 2 (two) times daily.    [provider]  ibuprofen (ADVIL,MOTRIN) 600 MG tablet Take 1 tablet (600 mg total) by mouth every 6 (six) hours as needed. 03/10/19   Gilda Crease, MD  metFORMIN (GLUCOPHAGE) 500 MG tablet Take by mouth 2 (two) times daily with a meal.    [provider]  sitaGLIPtin (JANUVIA) 50 MG tablet Take 50 mg by mouth daily.    [provider]    Family History History reviewed. No pertinent family history.  Social History Social History   Tobacco Use  . Smoking status: Current Every Day Smoker    Packs/day: 0.50    Types: Cigarettes  . Smokeless tobacco: Never  Used  Substance Use Topics  . Alcohol use: Not Currently  . Drug use: Never     Allergies   Patient has no known allergies.   Review of Systems Review of Systems  Genitourinary: Positive for testicular pain. Negative for penile swelling and scrotal swelling.  All other systems reviewed and are negative.    Physical Exam Updated Vital Signs BP 126/80 (BP Location: Left Arm)   Pulse 96   Temp 98.4 F (36.9 C) (Oral)   Resp 18   Ht 5\' 7"  (1.702 m)   Wt 68 kg   SpO2 98%   BMI 23.49 kg/m   Physical Exam Vitals signs and nursing note reviewed.  Constitutional:      General: He is not in acute distress.    Appearance: Normal appearance. He is well-developed.  HENT:     Head: Normocephalic and atraumatic.     Right Ear: Hearing normal.     Left Ear: Hearing normal.     Nose: Nose normal.  Eyes:     Conjunctiva/sclera: Conjunctivae normal.     Pupils: Pupils are equal, round, and reactive to light.  Neck:     Musculoskeletal: Normal range of motion and neck supple.  Cardiovascular:     Rate and Rhythm: Regular rhythm.  Heart sounds: S1 normal and S2 normal. No murmur. No friction rub. No gallop.   Pulmonary:     Effort: Pulmonary effort is normal. No respiratory distress.     Breath sounds: Normal breath sounds.  Chest:     Chest wall: No tenderness.  Abdominal:     General: Bowel sounds are normal.     Palpations: Abdomen is soft.     Tenderness: There is no abdominal tenderness. There is no guarding or rebound. Negative signs include Murphy's sign and McBurney's sign.     Hernia: No hernia is present.  Genitourinary:    Penis: Normal and circumcised.      Scrotum/Testes:        Right: Mass, tenderness or swelling not present.        Left: Tenderness (Epididymis) present. Mass or swelling not present.     Epididymis:     Left: Not enlarged. Tenderness present. No mass.  Musculoskeletal: Normal range of motion.  Skin:    General: Skin is warm and dry.      Findings: No rash.  Neurological:     Mental Status: He is alert and oriented to person, place, and time.     GCS: GCS eye subscore is 4. GCS verbal subscore is 5. GCS motor subscore is 6.     Cranial Nerves: No cranial nerve deficit.     Sensory: No sensory deficit.     Coordination: Coordination normal.  Psychiatric:        Speech: Speech normal.        Behavior: Behavior normal.        Thought Content: Thought content normal.      ED Treatments / Results  Labs (all labs ordered are listed, but only abnormal results are displayed) Labs Reviewed  URINE CULTURE  URINALYSIS, ROUTINE W REFLEX MICROSCOPIC    EKG None  Radiology No results found.  Procedures Procedures (including critical care time)  Medications Ordered in ED Medications  cephALEXin (KEFLEX) capsule 500 mg (has no administration in time range)     Initial Impression / Assessment and Plan / ED Course  I have reviewed the triage vital signs and the nursing notes.  Pertinent labs & imaging results that were available during my care of the patient were reviewed by me and considered in my medical decision making (see chart for details).        Patient presents with progressively worsening left testicular pain, onset earlier today.  Records indicate that he has a history of a scrotal abscess.  Overlying skin and scrotum is normal but he does have tenderness without swelling or enlargement of the epididymis.  Will treat empirically with antibiotics, follow-up with urology.   Final Clinical Impressions(s) / ED Diagnoses   Final diagnoses:  Epididymitis    ED Discharge Orders         Ordered    cephALEXin (KEFLEX) 500 MG capsule  2 times daily     03/10/19 2318    ibuprofen (ADVIL,MOTRIN) 600 MG tablet  Every 6 hours PRN     03/10/19 2318           Gilda Crease, MD 03/10/19 2318

## 2019-03-10 NOTE — ED Triage Notes (Signed)
Pt states the left side of his "groin area" is sore" pt states he had a cyst in the same area about a year ago.

## 2019-03-10 NOTE — ED Triage Notes (Signed)
Scrotal cyst removed 1 year ago   Pain last night   Pt of [facility]  Here by EMS

## 2019-03-11 LAB — URINALYSIS, ROUTINE W REFLEX MICROSCOPIC
Bilirubin Urine: NEGATIVE
Glucose, UA: NEGATIVE mg/dL
Hgb urine dipstick: NEGATIVE
Ketones, ur: NEGATIVE mg/dL
Nitrite: NEGATIVE
PROTEIN: NEGATIVE mg/dL
SPECIFIC GRAVITY, URINE: 1.011 (ref 1.005–1.030)
pH: 6 (ref 5.0–8.0)

## 2019-03-13 ENCOUNTER — Encounter (HOSPITAL_COMMUNITY): Payer: Self-pay | Admitting: Emergency Medicine

## 2019-03-13 LAB — URINE CULTURE: Culture: >=100000 — AB

## 2019-03-14 ENCOUNTER — Telehealth: Payer: Self-pay | Admitting: Emergency Medicine

## 2019-03-14 NOTE — Progress Notes (Signed)
ED Antimicrobial Stewardship Positive Culture Follow Up   Frank Dean is an 63 y.o. male who presented to [facility] on 03/10/2019 with a chief complaint of  Chief Complaint  Patient presents with  . Groin Pain    Recent Results (from the past 720 hour(s))  Urine Culture     Status: Abnormal   Collection Time: 03/11/19 12:06 AM  Result Value Ref Range Status   Specimen Description   Final    URINE, CLEAN CATCH Performed at [facility], [address], [place], [place] [address]    Special Requests   Final    NONE Performed at [facility], [address] [place], [place] [address]    Culture >=100,000 COLONIES/mL ENTEROCOCCUS FAECALIS (A)  Final   Report Status 03/13/2019 FINAL  Final   Organism ID, Bacteria ENTEROCOCCUS FAECALIS (A)  Final      Susceptibility   Enterococcus faecalis - MIC*    AMPICILLIN <=2 SENSITIVE Sensitive     LEVOFLOXACIN 0.5 SENSITIVE Sensitive     NITROFURANTOIN <=16 SENSITIVE Sensitive     VANCOMYCIN 1 SENSITIVE Sensitive     * >=100,000 COLONIES/mL ENTEROCOCCUS FAECALIS    [x]  Treated with Cephalexin, organism resistant to prescribed antimicrobial  Recommend: Stop taking Cephalexin New antibiotic prescription: Start Amoxicillin 500 mg po two times per day x 10 days  ED Provider: Burna Forts, PA-C   [name] 03/14/2019, 9:03 AM Clinical Pharmacist Monday - Friday phone -  [phone] Saturday - Sunday phone - [phone]

## 2019-03-14 NOTE — Telephone Encounter (Signed)
Post ED Visit - Positive Culture Follow-up: Successful Patient Follow-Up  Culture assessed and recommendations reviewed by:  []  Enzo Bi, Pharm.D. []  Celedonio Miyamoto, Pharm.D., BCPS AQ-ID []  Garvin Fila, Pharm.D., BCPS []  Georgina Pillion, Pharm.D., BCPS []  [place], [place].D., BCPS, AAHIVP []  Estella Husk, Pharm.D., BCPS, AAHIVP []  Lysle Pearl, PharmD, BCPS []  Phillips Climes, PharmD, BCPS []  Agapito Games, PharmD, BCPS []  Verlan Friends, PharmD Mervyn Gay PharmD  Positive urine culture  []  Patient discharged without antimicrobial prescription and treatment is now indicated [x]  Organism is resistant to prescribed ED discharge antimicrobial []  Patient with positive blood cultures  Changes discussed with ED provider: Eyvonne Mechanic PA New antibiotic prescription stop taking Keflex, start Amoxicillin 500mg  po q 12 hours x 10 days  Attempting to contact patient    Berle Mull 03/14/2019, 12:54 PM

## 2019-03-14 NOTE — Telephone Encounter (Signed)
Post ED Visit - Positive Culture Follow-up: Successful Patient Follow-Up  Culture assessed and recommendations reviewed by:  []  Enzo Bi, Pharm.D. []  Celedonio Miyamoto, Pharm.D., BCPS AQ-ID []  Garvin Fila, Pharm.D., BCPS []  Georgina Pillion, Pharm.D., BCPS []  [place], [address].D., BCPS, AAHIVP []  Estella Husk, Pharm.D., BCPS, AAHIVP []  Lysle Pearl, PharmD, BCPS []  Phillips Climes, PharmD, BCPS []  Agapito Games, PharmD, BCPS []  Verlan Friends, PharmD Mervyn Gay PharmD  Positive urine culture  []  Patient discharged without antimicrobial prescription and treatment is now indicated [x]  Organism is resistant to prescribed ED discharge antimicrobial []  Patient with positive blood cultures  Changes discussed with ED provider: Eyvonne Mechanic PA New antibiotic prescription stop taking keflex, start amoxicillin 500mg  po q 12 hours x 10 days Called to [facility]   Berle Mull 03/14/2019, 2:10 PM

## 2019-03-26 IMAGING — CT CT RENAL STONE PROTOCOL
2 of 4 series · 16 of 46 positions shown, 18 images · non-contrast
Comparison: None.

CLINICAL DATA: Flank and right lower quadrant pain

EXAM:
CT ABDOMEN AND PELVIS WITHOUT CONTRAST
TECHNIQUE: Multidetector CT imaging of the abdomen and pelvis was performed
following the standard protocol without IV contrast.

[Series 2: stone full standard · axial · 0.71mm/px · z∈[-409,-54]mm · 13 of 79 slices shown, 15 images]
[im 4/79  soft-tissue]
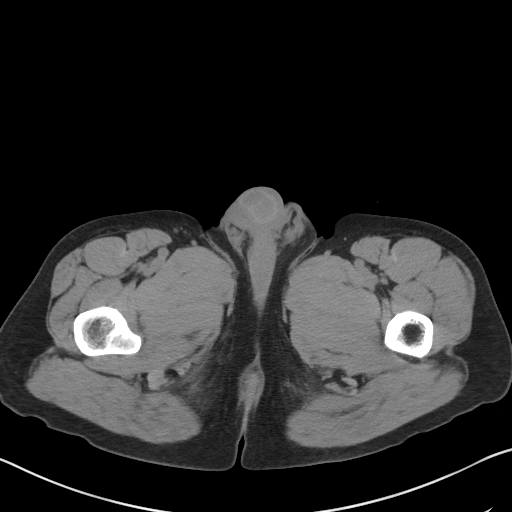
[im 4/79  bone]
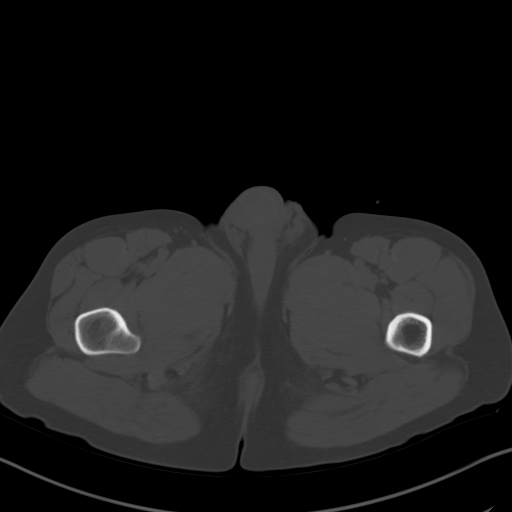
[im 10/79  soft-tissue]
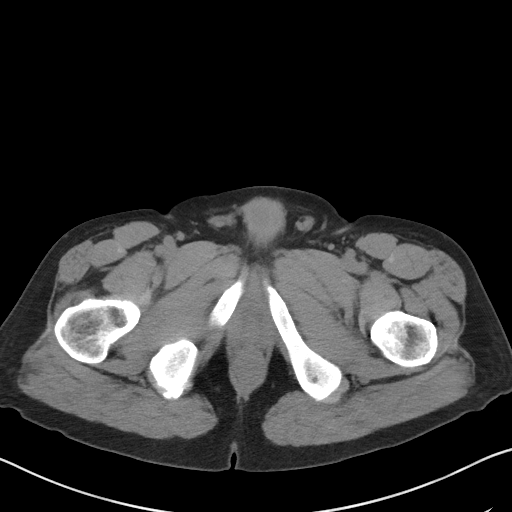
[im 16/79  soft-tissue]
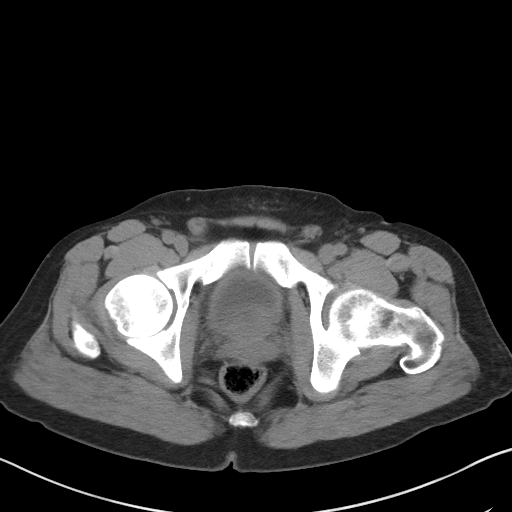
[im 22/79  soft-tissue]
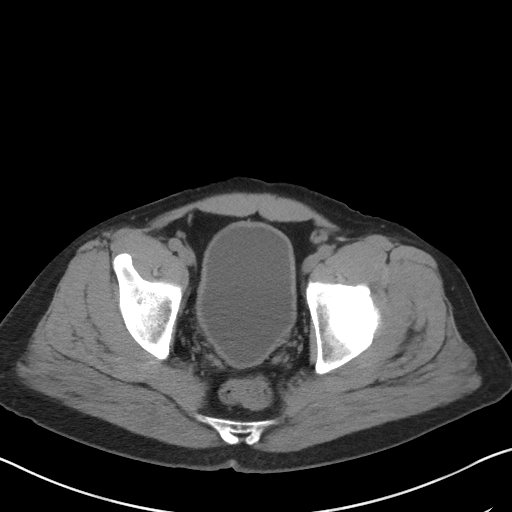
[im 29/79  soft-tissue]
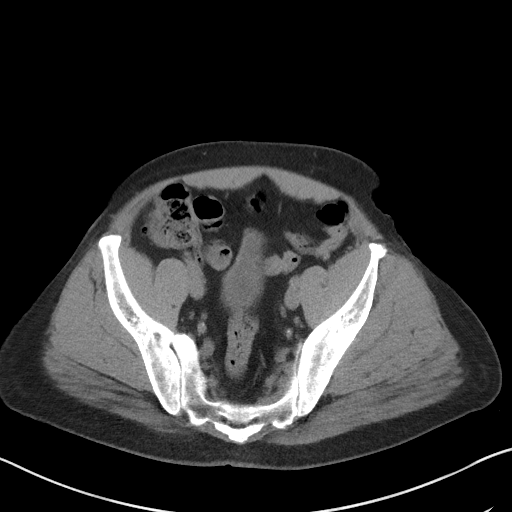
[im 35/79  soft-tissue]
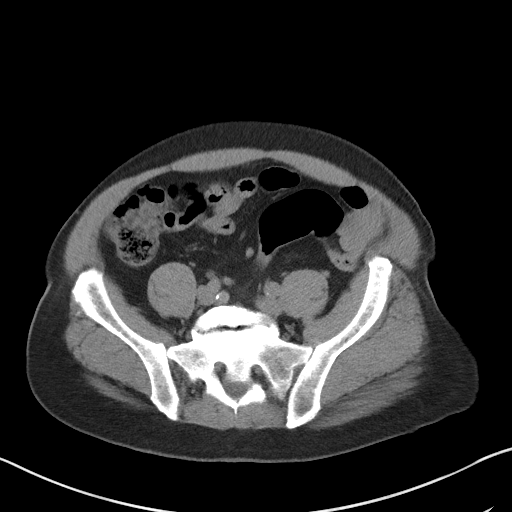
[im 41/79  soft-tissue]
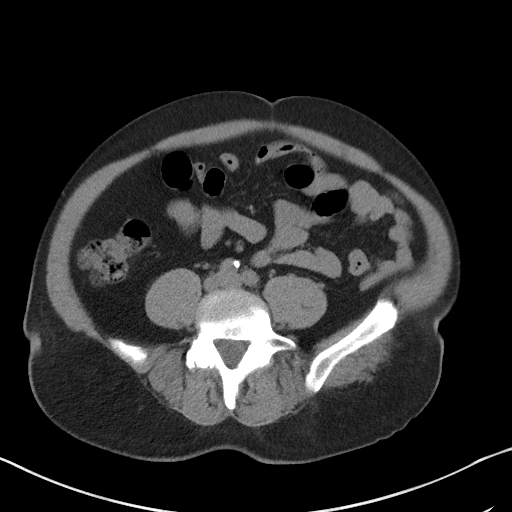
[im 44/79  soft-tissue]
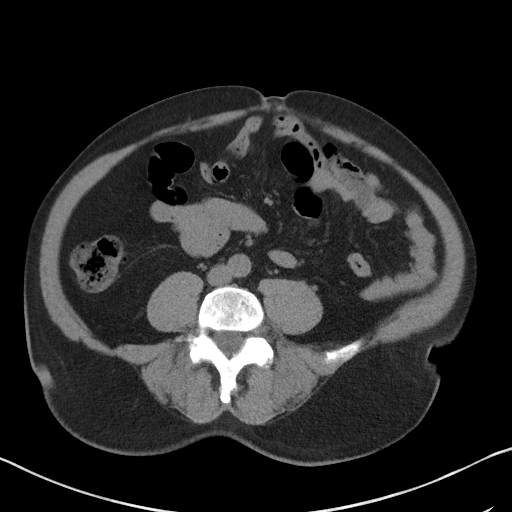
[im 50/79  soft-tissue]
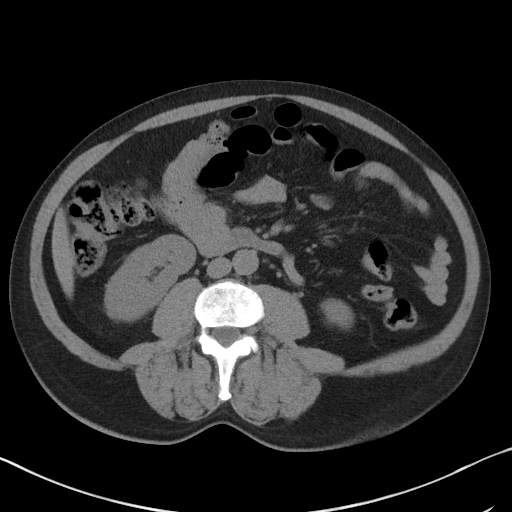
[im 50/79  bone]
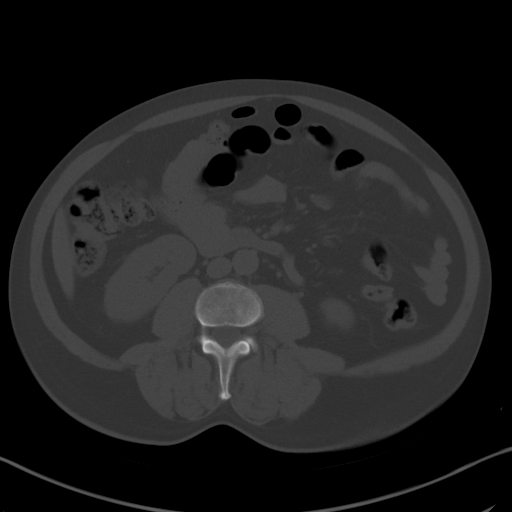
[im 57/79  soft-tissue]
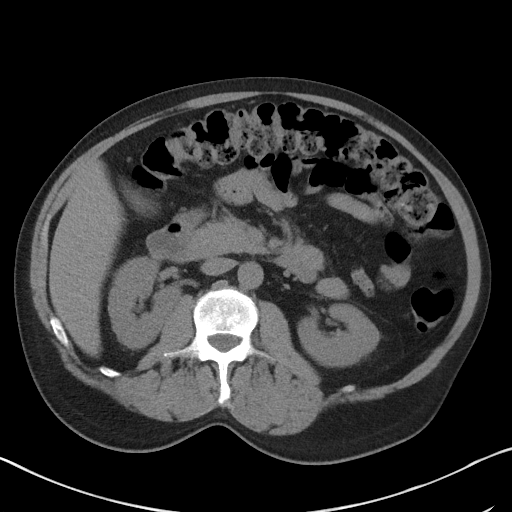
[im 63/79  soft-tissue]
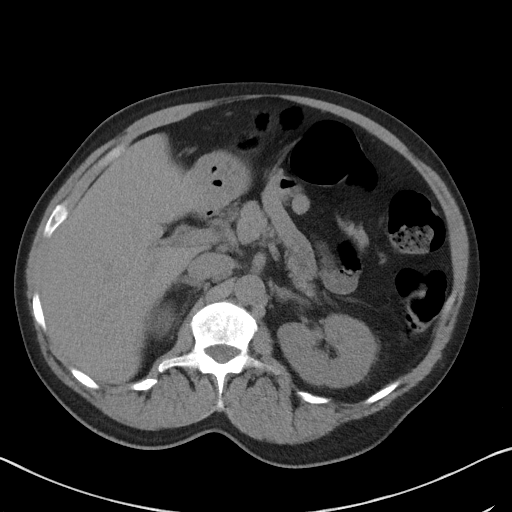
[im 69/79  soft-tissue]
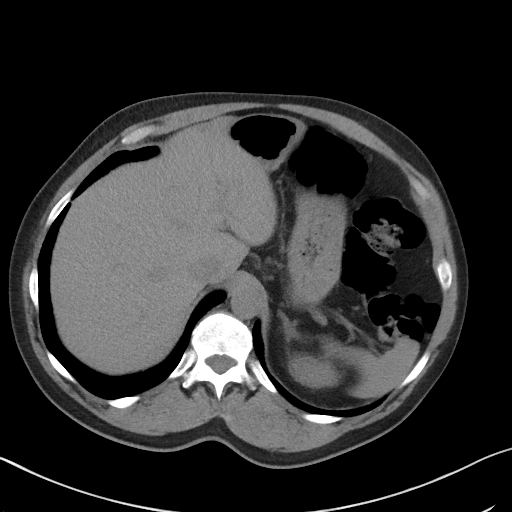
[im 75/79  soft-tissue]
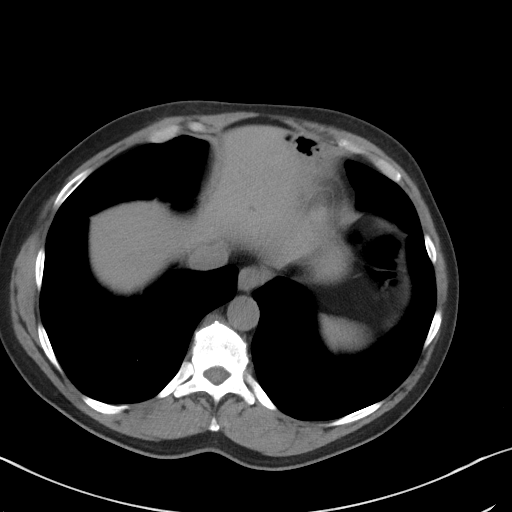

[Series 5: coronal · coronal · 0.66mm/px · 3 of 140 slices shown]
[im 47/140  soft-tissue]
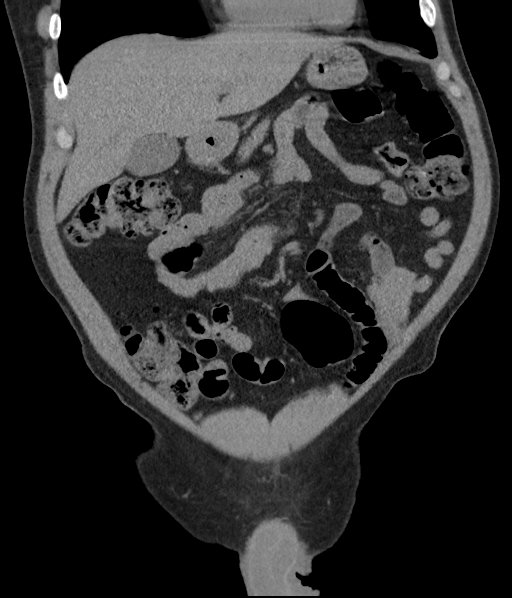
[im 62/140  soft-tissue]
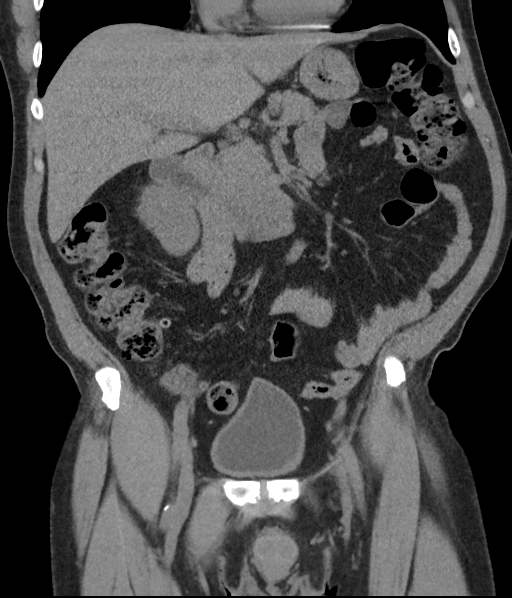
[im 78/140  soft-tissue]
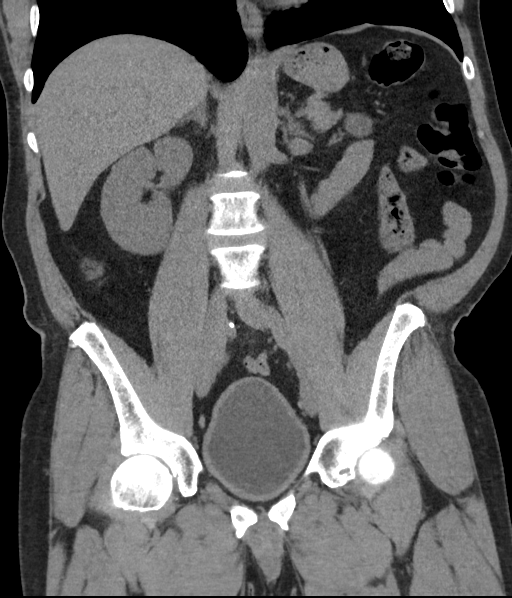

[16 of 46 positions shown; findings below may reference images not displayed]

FINDINGS: Lower chest: Lung bases demonstrate no acute airspace disease or
pleural effusion. The heart size is within normal limits.

Hepatobiliary: No focal liver abnormality is seen. No gallstones,
gallbladder wall thickening, or biliary dilatation.

Pancreas: Unremarkable. No pancreatic ductal dilatation or
surrounding inflammatory changes.

Spleen: Normal in size without focal abnormality.

Adrenals/Urinary Tract: Left adrenal gland is normal. Coarse
calcification in right adrenal gland, possible sequela of old trauma
or hemorrhage. No soft tissue mass.

No calcified renal stones. Contour deformity at the mid right kidney
may be due to focal scar. No hydronephrosis or ureteral stone.
Slightly thick-walled appearance of the bladder.

Stomach/Bowel: Stomach is within normal limits. Appendix appears
normal. No evidence of bowel wall thickening, distention, or
inflammatory changes. Scattered colon diverticula without acute
inflammatory change

Vascular/Lymphatic: Mild aortic atherosclerosis. No aneurysmal
dilatation. No significantly enlarged lymph nodes

Reproductive: Slightly enlarged prostate with calcification

Other: Negative for free air or free fluid.

Musculoskeletal: Degenerative changes at L5-S1. No acute or
suspicious abnormality.
IMPRESSION: 1. Negative for hydronephrosis or ureteral stone. Negative for acute
appendicitis.
2. Slightly thick-walled appearance of the bladder is questionable
for a mild cystitis versus chronic obstruction
3. Scattered colon diverticula without acute inflammatory change.
